# Patient Record
Sex: Female | Born: 1975 | Race: Black or African American | Hispanic: No | Marital: Married | Smoking: Never smoker
Health system: Southern US, Community
[De-identification: ages and names within clinical notes are randomized; demographics above are authoritative.]

## PROBLEM LIST (undated history)

## (undated) DIAGNOSIS — Z789 Other specified health status: Secondary | ICD-10-CM

## (undated) HISTORY — PX: BREAST BIOPSY: SHX20

## (undated) SURGERY — Surgical Case
Anesthesia: *Unknown

---

## 2013-04-25 ENCOUNTER — Emergency Department (HOSPITAL_BASED_OUTPATIENT_CLINIC_OR_DEPARTMENT_OTHER)
Admission: EM | Admit: 2013-04-25 | Discharge: 2013-04-25 | Disposition: A | Discharge status: Home / Self Care | Payer: BC Managed Care – PPO | Attending: Emergency Medicine | Admitting: Emergency Medicine

## 2013-04-25 ENCOUNTER — Encounter (HOSPITAL_BASED_OUTPATIENT_CLINIC_OR_DEPARTMENT_OTHER): Payer: Self-pay | Admitting: Emergency Medicine

## 2013-04-25 DIAGNOSIS — Z792 Long term (current) use of antibiotics: Secondary | ICD-10-CM | POA: Insufficient documentation

## 2013-04-25 DIAGNOSIS — Y929 Unspecified place or not applicable: Secondary | ICD-10-CM | POA: Insufficient documentation

## 2013-04-25 DIAGNOSIS — Z23 Encounter for immunization: Secondary | ICD-10-CM | POA: Insufficient documentation

## 2013-04-25 DIAGNOSIS — S61209A Unspecified open wound of unspecified finger without damage to nail, initial encounter: Secondary | ICD-10-CM | POA: Insufficient documentation

## 2013-04-25 DIAGNOSIS — Y939 Activity, unspecified: Secondary | ICD-10-CM | POA: Insufficient documentation

## 2013-04-25 DIAGNOSIS — W268XXA Contact with other sharp object(s), not elsewhere classified, initial encounter: Secondary | ICD-10-CM | POA: Insufficient documentation

## 2013-04-25 DIAGNOSIS — S61219A Laceration without foreign body of unspecified finger without damage to nail, initial encounter: Secondary | ICD-10-CM

## 2013-04-25 MED ORDER — TETANUS-DIPHTH-ACELL PERTUSSIS 5-2.5-18.5 LF-MCG/0.5 IM SUSP
0.5000 mL | Freq: Once | INTRAMUSCULAR | Status: AC
  Administered 2013-04-25: 0.5 mL via INTRAMUSCULAR
  Filled 2013-04-25: qty 0.5

## 2013-04-25 NOTE — ED Notes (Signed)
Laceration to left middle finger.  Bleeding through bandaid.

## 2013-04-25 NOTE — ED Notes (Signed)
Suture cart is at the bedside set up and ready for the doctor to use. 

## 2013-04-25 NOTE — ED Provider Notes (Signed)
CSN: [id number]     Arrival date & time 04/25/13  1735 History   First MD Initiated Contact with Patient 04/25/13 1910     Chief Complaint  Patient presents with  . Extremity Laceration   (Consider location/radiation/quality/duration/timing/severity/associated sxs/prior Treatment) Patient is a 37 y.o. female presenting with skin laceration. The history is provided by the patient. No language interpreter was used.  Laceration Location:  Finger Finger laceration location:  L middle finger Length (cm):  2 Depth:  Cutaneous Quality: avulsion   Bleeding: controlled   Time since incident:  1 hour Injury mechanism: scizzors. Pain details:    Severity:  No pain Foreign body present:  No foreign bodies Relieved by:  Nothing Worsened by:  Nothing tried Tetanus status:  Out of date Pt cut her finger with a scizzor.  Pt is a hairsylist   No past medical history on file. Past Surgical History  Procedure Laterality Date  . Cesarean section     No family history on file. History  Substance Use Topics  . Smoking status: Never Smoker   . Smokeless tobacco: Not on file  . Alcohol Use: Yes     Comment: occasional   OB History   Grav Para Term Preterm Abortions TAB SAB Ect Mult Living                 Review of Systems  Skin: Positive for wound.  All other systems reviewed and are negative.    Allergies  Review of patient's allergies indicates no known allergies.  Home Medications   Current Outpatient Rx  Name  Route  Sig  Dispense  Refill  . amoxicillin (AMOXIL) 250 MG capsule   Oral   Take 250 mg by mouth 3 (three) times daily.          BP 127/77  Pulse 75  Temp(Src) 99 F (37.2 C) (Oral)  Resp 16  Ht 5\' 7"  (1.702 m)  Wt 176 lb (79.833 kg)  BMI 27.56 kg/m2  SpO2 100%  LMP 03/25/2013 Physical Exam  Nursing note and vitals reviewed. Constitutional: She appears well-developed and well-nourished.  Cardiovascular: Normal rate.   Pulmonary/Chest: Effort normal.   Musculoskeletal: She exhibits tenderness.  2mm laceration middle dorsal finger,  gapping  Neurological: She is alert.  Skin: Skin is warm.  Psychiatric: She has a normal mood and affect.    ED Course  Procedures (including critical care time) Labs Review Labs Reviewed - No data to display Imaging Review No results found.  MDM   1. Laceration of finger of left hand, initial encounter    When edges are pulled together knuckle tissue puckers,    I advised pt steri stips  Will give less tension.   Td given    Elson Areas, PA-C 04/25/13 1954  Lonia Skinner Alva, PA-C 04/25/13 1955

## 2013-04-26 NOTE — ED Provider Notes (Signed)
Medical screening examination/treatment/procedure(s) were performed by non-physician practitioner and as supervising physician I was immediately available for consultation/collaboration.  Hurman Horn, MD 04/26/13 1341

## 2018-05-09 ENCOUNTER — Other Ambulatory Visit: Payer: Self-pay | Admitting: Obstetrics and Gynecology

## 2019-07-28 ENCOUNTER — Inpatient Hospital Stay (HOSPITAL_COMMUNITY)
Admission: AD | Admit: 2019-07-28 | Discharge: 2019-07-28 | Disposition: A | Discharge status: Home / Self Care | Payer: BC Managed Care – PPO | Attending: Obstetrics and Gynecology | Admitting: Obstetrics and Gynecology

## 2019-07-28 ENCOUNTER — Other Ambulatory Visit: Payer: Self-pay

## 2019-07-28 ENCOUNTER — Encounter (HOSPITAL_COMMUNITY): Payer: Self-pay | Admitting: Obstetrics and Gynecology

## 2019-07-28 DIAGNOSIS — Z3A01 Less than 8 weeks gestation of pregnancy: Secondary | ICD-10-CM | POA: Insufficient documentation

## 2019-07-28 DIAGNOSIS — O009 Unspecified ectopic pregnancy without intrauterine pregnancy: Secondary | ICD-10-CM | POA: Diagnosis present

## 2019-07-28 DIAGNOSIS — O09511 Supervision of elderly primigravida, first trimester: Secondary | ICD-10-CM | POA: Diagnosis not present

## 2019-07-28 LAB — COMPREHENSIVE METABOLIC PANEL
ALT: 16 U/L (ref 0–44)
AST: 14 U/L — ABNORMAL LOW (ref 15–41)
Albumin: 3.8 g/dL (ref 3.5–5.0)
Alkaline Phosphatase: 49 U/L (ref 38–126)
Anion gap: 9 (ref 5–15)
BUN: 13 mg/dL (ref 6–20)
CO2: 25 mmol/L (ref 22–32)
Calcium: 9 mg/dL (ref 8.9–10.3)
Chloride: 105 mmol/L (ref 98–111)
Creatinine, Ser: 0.63 mg/dL (ref 0.44–1.00)
GFR calc Af Amer: >60 mL/min (ref >60)
GFR calc non Af Amer: >60 mL/min (ref >60)
Glucose, Bld: 86 mg/dL (ref 70–99)
Potassium: 3.8 mmol/L (ref 3.5–5.1)
Sodium: 139 mmol/L (ref 135–145)
Total Bilirubin: 0.6 mg/dL (ref 0.3–1.2)
Total Protein: 7.4 g/dL (ref 6.5–8.1)

## 2019-07-28 LAB — CBC WITH DIFFERENTIAL/PLATELET
Abs Immature Granulocytes: 0.01 10*3/uL (ref 0.00–0.07)
Basophils Absolute: 0 10*3/uL (ref 0.0–0.1)
Basophils Relative: 0 %
Eosinophils Absolute: 0.1 10*3/uL (ref 0.0–0.5)
Eosinophils Relative: 2 %
HCT: 34.6 % — ABNORMAL LOW (ref 36.0–46.0)
Hemoglobin: 11.2 g/dL — ABNORMAL LOW (ref 12.0–15.0)
Immature Granulocytes: 0 %
Lymphocytes Relative: 23 %
Lymphs Abs: 1.2 10*3/uL (ref 0.7–4.0)
MCH: 29.9 pg (ref 26.0–34.0)
MCHC: 32.4 g/dL (ref 30.0–36.0)
MCV: 92.3 fL (ref 80.0–100.0)
Monocytes Absolute: 0.4 10*3/uL (ref 0.1–1.0)
Monocytes Relative: 7 %
Neutro Abs: 3.5 10*3/uL (ref 1.7–7.7)
Neutrophils Relative %: 68 %
Platelets: 202 10*3/uL (ref 150–400)
RBC: 3.75 MIL/uL — ABNORMAL LOW (ref 3.87–5.11)
RDW: 13.5 % (ref 11.5–15.5)
WBC: 5.3 10*3/uL (ref 4.0–10.5)
nRBC: 0 % (ref 0.0–0.2)

## 2019-07-28 LAB — HCG, QUANTITATIVE, PREGNANCY: hCG, Beta Chain, Quant, S: 155 m[IU]/mL — ABNORMAL HIGH (ref <5)

## 2019-07-28 MED ORDER — METHOTREXATE FOR ECTOPIC PREGNANCY
50.0000 mg/m2 | Freq: Once | INTRAMUSCULAR | Status: AC
  Administered 2019-07-28: 13:00:00 95 mg via INTRAMUSCULAR
  Filled 2019-07-28: qty 1

## 2019-07-28 NOTE — Discharge Instructions (Signed)
Return to care   If you have heavier bleeding that soaks through more that 2 pads per hour for an hour or more  If you bleed so much that you feel like you might pass out or you do pass out  If you have significant abdominal pain that is not improved with Tylenol   If you develop a fever > 100.5   Methotrexate Treatment for an Ectopic Pregnancy, Care After This sheet gives you information about how to care for yourself after your procedure. Your health care provider may also give you more specific instructions. If you have problems or questions, contact your health care provider. What can I expect after the procedure? After the procedure, it is common to have:  Abdominal cramping.  Vaginal bleeding.  Fatigue.  Nausea.  Vomiting.  Diarrhea. Blood tests will be taken at timed intervals for several days or weeks to check your pregnancy hormone levels. The blood tests will be done until the pregnancy hormone can no longer be detected in the blood. Follow these instructions at home: Activity  Do not have sex until your health care provider approves.  Limit activities that take a lot of effort as told by your health care provider. Medicines  Take over the counter and prescription medicines only as told by your health care provider.  Do not take aspirin, ibuprofen, naproxen, or any other NSAIDs.  Do not take folic acid, prenatal vitamins, or other vitamins that contain folic acid. General instructions   Do not drink alcohol.  Follow instructions from your health care provider on how and when to report any symptoms that may indicate a ruptured ectopic pregnancy.  Keep all follow-up visits as told by your health care provider. This is important. Contact a health care provider if:  You have persistent nausea and vomiting.  You have persistent diarrhea.  You are having a reaction to the medicine, such as: ? Tiredness. ? Skin rash. ? Hair loss. Get help right away  if:  Your abdominal or pelvic pain gets worse.  You have more vaginal bleeding.  You feel light-headed or you faint.  You have shortness of breath.  Your heart rate increases.  You develop a cough.  You have chills.  You have a fever. Summary  After the procedure, it is common to have symptoms of abdominal cramping, vaginal bleeding and fatigue. You may also experience other symptoms.  Blood tests will be taken at timed intervals for several days or weeks to check your pregnancy hormone levels. The blood tests will be done until the pregnancy hormone can no longer be detected in the blood.  Limit strenuous activity as told by your health care provider.  Follow instructions from your health care provider on how and when to report any symptoms that may indicate a ruptured ectopic pregnancy. This information is not intended to replace advice given to you by your health care provider. Make sure you discuss any questions you have with your health care provider. Document Released: 07/12/2011 Document Revised: 07/05/2017 Document Reviewed: 09/11/2016 Elsevier Patient Education  2020 [employer].

## 2019-07-28 NOTE — MAU Note (Signed)
.   Shannon Hensley is a 43 y.o. at [redacted]w[redacted]d here in MAU reporting: that she was sent over from office for methotrexate. Mild lower abdominal cramping per pt, with dark brown vaginal discharge  LMP: 07/07/19 Onset of complaint: ongoing Pain score: 2 Vitals:   07/28/19 1117  BP: 129/65  Pulse: (!) 121  Resp: 16  Temp: 98.2 F (36.8 C)     FHT Lab orders placed from triage:

## 2019-07-28 NOTE — MAU Provider Note (Signed)
Chief Complaint: Ectopic Pregnancy   First Provider Initiated Contact with Patient 07/28/19 1123     SUBJECTIVE HPI: Shannon Hensley is a 43 y.o. G1P0 at 21w0dby LMP (hx of irregular cycles) who presents to [facility] from the office for treatment of suspected ectopic pregnancy.  Patient reports lower abdominal cramping and vaginal spotting for the last month. Has had labs monitored in the office by Dr. PAlwyn Pea  Currently rates pain 2/10. Is not saturating pads or passing blood clots.    History reviewed. No pertinent past medical history. OB History  Gravida Para Term Preterm AB Living  5 3 3   1     SAB TAB Ectopic Multiple Live Births      1        # Outcome Date GA Lbr Len/2nd Weight Sex Delivery Anes PTL Lv  5 Current           4 Ectopic           3 Term      CS-LTranv     2 Term      CS-LTranv     1 Term      CS-LTranv      Past Surgical History:  Procedure Laterality Date  . CESAREAN SECTION     Social History   Socioeconomic History  . Marital status: Married    Spouse name: Not on file  . Number of children: Not on file  . Years of education: Not on file  . Highest education level: Not on file  Occupational History  . Not on file  Tobacco Use  . Smoking status: Never Smoker  Substance and Sexual Activity  . Alcohol use: Yes    Comment: occasional  . Drug use: Not on file  . Sexual activity: Yes  Other Topics Concern  . Not on file  Social History Narrative  . Not on file   Social Determinants of Health   Financial Resource Strain:   . Difficulty of Paying Living Expenses: Not on file  Food Insecurity:   . Worried About RCharity fundraiserin the Last Year: Not on file  . Ran Out of Food in the Last Year: Not on file  Transportation Needs:   . Lack of Transportation (Medical): Not on file  . Lack of Transportation (Non-Medical): Not on file  Physical Activity:   . Days of Exercise per Week: Not on file  . Minutes of Exercise per Session: Not  on file  Stress:   . Feeling of Stress : Not on file  Social Connections:   . Frequency of Communication with Friends and Family: Not on file  . Frequency of Social Gatherings with Friends and Family: Not on file  . Attends Religious Services: Not on file  . Active Member of Clubs or Organizations: Not on file  . Attends CArchivistMeetings: Not on file  . Marital Status: Not on file  Intimate Partner Violence:   . Fear of Current or Ex-Partner: Not on file  . Emotionally Abused: Not on file  . Physically Abused: Not on file  . Sexually Abused: Not on file   History reviewed. No pertinent family history. No current facility-administered medications on file prior to encounter.   No current outpatient medications on file prior to encounter.   No Known Allergies  I have reviewed patient's Past Medical Hx, Surgical Hx, Family Hx, Social Hx, medications and allergies.   Review of Systems  Constitutional: Negative.  Gastrointestinal: Positive for abdominal pain.  Genitourinary: Positive for vaginal bleeding.    OBJECTIVE Patient Vitals for the past 24 hrs:  BP Temp Pulse Resp Height Weight  07/28/19 1148 -- -- 76 -- -- --  07/28/19 1123 -- -- -- -- 5' 6"  (1.676 m) 77.6 kg  07/28/19 1117 129/65 98.2 F (36.8 C) (!) 121 16 -- --   Constitutional: Well-developed, well-nourished female in no acute distress.  Cardiovascular: normal rate & rhythm, no murmur Respiratory: normal rate and effort. Lung sounds clear throughout GI: Abd soft, non-tender, Pos BS x 4. No guarding or rebound tenderness MS: Extremities nontender, no edema, normal ROM Neurologic: Alert and oriented x 4.     LAB RESULTS Results for orders placed or performed during the hospital encounter of 07/28/19 (from the past 24 hour(s))  hCG, quantitative, pregnancy     Status: Abnormal   Collection Time: 07/28/19 11:52 AM  Result Value Ref Range   hCG, Beta Chain, Quant, S 155 (H) <5 mIU/mL  CBC WITH  DIFFERENTIAL     Status: Abnormal   Collection Time: 07/28/19 11:52 AM  Result Value Ref Range   WBC 5.3 4.0 - 10.5 K/uL   RBC 3.75 (L) 3.87 - 5.11 MIL/uL   Hemoglobin 11.2 (L) 12.0 - 15.0 g/dL   HCT 34.6 (L) 36.0 - 46.0 %   MCV 92.3 80.0 - 100.0 fL   MCH 29.9 26.0 - 34.0 pg   MCHC 32.4 30.0 - 36.0 g/dL   RDW 13.5 11.5 - 15.5 %   Platelets 202 150 - 400 K/uL   nRBC 0.0 0.0 - 0.2 %   Neutrophils Relative % 68 %   Neutro Abs 3.5 1.7 - 7.7 K/uL   Lymphocytes Relative 23 %   Lymphs Abs 1.2 0.7 - 4.0 K/uL   Monocytes Relative 7 %   Monocytes Absolute 0.4 0.1 - 1.0 K/uL   Eosinophils Relative 2 %   Eosinophils Absolute 0.1 0.0 - 0.5 K/uL   Basophils Relative 0 %   Basophils Absolute 0.0 0.0 - 0.1 K/uL   Immature Granulocytes 0 %   Abs Immature Granulocytes 0.01 0.00 - 0.07 K/uL  Comprehensive metabolic panel     Status: Abnormal   Collection Time: 07/28/19 11:52 AM  Result Value Ref Range   Sodium 139 135 - 145 mmol/L   Potassium 3.8 3.5 - 5.1 mmol/L   Chloride 105 98 - 111 mmol/L   CO2 25 22 - 32 mmol/L   Glucose, Bld 86 70 - 99 mg/dL   BUN 13 6 - 20 mg/dL   Creatinine, Ser 0.63 0.44 - 1.00 mg/dL   Calcium 9.0 8.9 - 10.3 mg/dL   Total Protein 7.4 6.5 - 8.1 g/dL   Albumin 3.8 3.5 - 5.0 g/dL   AST 14 (L) 15 - 41 U/L   ALT 16 0 - 44 U/L   Alkaline Phosphatase 49 38 - 126 U/L   Total Bilirubin 0.6 0.3 - 1.2 mg/dL   GFR calc non Af Amer >60 >60 mL/min   GFR calc Af Amer >60 >60 mL/min   Anion gap 9 5 - 15    IMAGING No results found.  MAU COURSE Orders Placed This Encounter  Procedures  . hCG, quantitative, pregnancy  . CBC WITH DIFFERENTIAL  . Comprehensive metabolic panel  . Notify physician  . Discharge patient   Meds ordered this encounter  Medications  . methotrexate [facility]) chemo injection kit 95 mg    MDM Dr. Alwyn Pea  called the unit yesterday evening regarding patient coming to MAU for methotrexate injection. Records were faxed from the office as she wanted  MAU provider to manage administration & orders.  Per review of faxed records, patient had abnormally rising HCGs (12/14=81, 12/17=108, 12/21=113), ultrasound showed no IUP or adnexal mass. RH positive.  Will send paperwork to medical records to be scanned.   Pt stable in MAU. Minimal cramping & spotting.  Labs collected for methotrexate admin (CBC, CMP, HCG) Hemoglobin, kidney function, & liver function normal. HCG 155 today Methotrexate given Pt will return to MAU on Friday for day 4 labs (office closed for holiday)   ASSESSMENT 1. Ectopic pregnancy without intrauterine pregnancy, unspecified location     PLAN Discharge home in stable condition. Ectopic precautions reviewed Discontinue prenatal vitamins Tylenol prn pain, discussed no NSAIDs Return Friday for day 4 HCG   Follow-up Information    [facility]. Go on 07/31/2019.   Specialty: Obstetrics and Gynecology Why: Return to MAU on Friday for bloodwork or sooner for worsening symptoms Contact information: [address] [id number] [place] [id number]         Allergies as of 07/28/2019   No Known Allergies     Medication List    STOP taking these medications   amoxicillin 250 MG capsule Commonly known as: Olga Coaster, NP 07/28/2019  1:52 PM

## 2019-07-31 ENCOUNTER — Inpatient Hospital Stay (HOSPITAL_COMMUNITY)
Admission: AD | Admit: 2019-07-31 | Discharge: 2019-07-31 | Disposition: A | Discharge status: Home / Self Care | Payer: BC Managed Care – PPO | Attending: Obstetrics and Gynecology | Admitting: Obstetrics and Gynecology

## 2019-07-31 ENCOUNTER — Other Ambulatory Visit: Payer: Self-pay

## 2019-07-31 ENCOUNTER — Inpatient Hospital Stay (HOSPITAL_COMMUNITY): Payer: BC Managed Care – PPO

## 2019-07-31 DIAGNOSIS — O09521 Supervision of elderly multigravida, first trimester: Secondary | ICD-10-CM | POA: Insufficient documentation

## 2019-07-31 DIAGNOSIS — R109 Unspecified abdominal pain: Secondary | ICD-10-CM

## 2019-07-31 DIAGNOSIS — Z3A01 Less than 8 weeks gestation of pregnancy: Secondary | ICD-10-CM | POA: Diagnosis not present

## 2019-07-31 DIAGNOSIS — O009 Unspecified ectopic pregnancy without intrauterine pregnancy: Secondary | ICD-10-CM

## 2019-07-31 LAB — HCG, QUANTITATIVE, PREGNANCY: hCG, Beta Chain, Quant, S: 157 m[IU]/mL — ABNORMAL HIGH (ref <5)

## 2019-07-31 NOTE — MAU Note (Signed)
Pt here for HCG level, day 4 post MTX.  States is doing ok.  Had horrible pain in lower abd, had her doubled over, couldn't walk. ( rated it a 10) Had discussion with pt about severity and seriousness of ectopic preg.  As her vs are stable and her pain is a 1 today, she is now fine. But stressed to pt , this is not something we ignore or take lightly- regardless of how low the numbers are. Pt verbalized understanding/compliance.  No longer bleeding, stopped after injection.  Pt to lobby to wait on results.

## 2019-07-31 NOTE — MAU Provider Note (Signed)
Subjective:  Shannon Hensley is a 43 y.o. G5P3010 at [redacted]w[redacted]d who presents today for FU BHCG. She was seen on 07/28/2019 for her first dose of MTX. Pt returns today for Day 4 hCG. Day 1 HCG 155. She denies vaginal bleeding. She reports abdominal or pelvic pain. Pt reports around 5AM today she experienced the worst pain she's ever had and rated it as 10/10. Pt reports she took a hot bath and the pain lessened. Pt reports pain currently as 3/10 and reports feeling "off" and endorses nausea and fatigue.  Objective:  Physical Exam  Nursing note and vitals reviewed.  Patient Vitals for the past 24 hrs:  BP Temp Temp src Pulse Resp SpO2  07/31/19 1554 128/77 99.7 F (37.6 C) Oral 66 16 100 %   Constitutional: She is oriented to person, place, and time. She appears well-developed and well-nourished. No distress.  HENT:  Head: Normocephalic.  Cardiovascular: Normal rate.  Respiratory: Effort normal.  GI: Soft. There is no tenderness.  Neurological: She is alert and oriented to person, place, and time. Skin: Skin is warm and dry.  Psychiatric: She has a normal mood and affect.   Results for orders placed or performed during the hospital encounter of 07/31/19 (from the past 24 hour(s))  hCG, quantitative, pregnancy     Status: Abnormal   Collection Time: 07/31/19  3:36 PM  Result Value Ref Range   hCG, Beta Chain, Quant, S 157 (H) <5 mIU/mL   US OB LESS THAN 14 WEEKS WITH OB TRANSVAGINAL  Result Date: 07/31/2019 CLINICAL DATA:  Patient has known ectopic pregnancy diagnosed by in office ultrasound. EXAM: OBSTETRIC <14 WK [place] AND TRANSVAGINAL OB US TECHNIQUE: Both transabdominal and transvaginal ultrasound examinations were performed for complete evaluation of the gestation as well as the maternal uterus, adnexal regions, and pelvic cul-de-sac. Transvaginal technique was performed to assess early pregnancy. COMPARISON:  None. FINDINGS: Intrauterine gestational sac: None. Subchorionic hemorrhage:  None  visualized. Maternal uterus/adnexae: Simple cyst or follicle noted right ovary. Left ovary unremarkable. Complex hypervascular lesion noted in the left adnexal space adjacent to the ovary. There may be some trace, simple free fluid in the cul-de-sac, but no evidence for hemoperitoneum. IMPRESSION: Complex left adnexal process measuring 2.6 x 3.3 x 1.9 cm identified adjacent to the left ovary. This is not an obvious ectopic gestational sac by ultrasound, but by report the patient is several days status post methotrexate treatment. This lesion does show some color signal on Doppler imaging and could represent an ectopic gestation. No substantial intraperitoneal free fluid or evidence of hemoperitoneum. Electronically Signed   By: Misty Stanley M.D.   On: 07/31/2019 17:41   Assessment/Plan: Ectopic pregnancy HCG appropriate for Day 4 Transvaginal US shows no substantial intraperitoneal free fluid or evidence of hemoperitoneum. FU Day 7 for: repeat hCG at [place] office as planned. Strict ectopic precautions given Pt discharged to home in stable condition

## 2019-08-01 ENCOUNTER — Other Ambulatory Visit: Payer: Self-pay

## 2019-08-01 ENCOUNTER — Inpatient Hospital Stay (HOSPITAL_COMMUNITY): Payer: BC Managed Care – PPO

## 2019-08-01 ENCOUNTER — Encounter (HOSPITAL_COMMUNITY): Payer: Self-pay | Admitting: Obstetrics and Gynecology

## 2019-08-01 ENCOUNTER — Observation Stay (HOSPITAL_COMMUNITY)
Admission: AD | Admit: 2019-08-01 | Discharge: 2019-08-02 | Disposition: A | Discharge status: Home / Self Care | Payer: BC Managed Care – PPO | Attending: Obstetrics & Gynecology | Admitting: Obstetrics & Gynecology

## 2019-08-01 DIAGNOSIS — Z20828 Contact with and (suspected) exposure to other viral communicable diseases: Secondary | ICD-10-CM | POA: Insufficient documentation

## 2019-08-01 DIAGNOSIS — O26891 Other specified pregnancy related conditions, first trimester: Secondary | ICD-10-CM

## 2019-08-01 DIAGNOSIS — R102 Pelvic and perineal pain: Secondary | ICD-10-CM | POA: Insufficient documentation

## 2019-08-01 DIAGNOSIS — Z3A01 Less than 8 weeks gestation of pregnancy: Secondary | ICD-10-CM | POA: Diagnosis not present

## 2019-08-01 DIAGNOSIS — O009 Unspecified ectopic pregnancy without intrauterine pregnancy: Secondary | ICD-10-CM | POA: Diagnosis not present

## 2019-08-01 HISTORY — DX: Other specified health status: Z78.9

## 2019-08-01 LAB — CBC
HCT: 33.6 % — ABNORMAL LOW (ref 36.0–46.0)
HCT: 31.7 % — ABNORMAL LOW (ref 36.0–46.0)
Hemoglobin: 10.6 g/dL — ABNORMAL LOW (ref 12.0–15.0)
Hemoglobin: 10.2 g/dL — ABNORMAL LOW (ref 12.0–15.0)
MCH: 29.7 pg (ref 26.0–34.0)
MCH: 29.5 pg (ref 26.0–34.0)
MCHC: 31.5 g/dL (ref 30.0–36.0)
MCHC: 32.2 g/dL (ref 30.0–36.0)
MCV: 94.1 fL (ref 80.0–100.0)
MCV: 91.6 fL (ref 80.0–100.0)
Platelets: 198 10*3/uL (ref 150–400)
Platelets: 184 10*3/uL (ref 150–400)
RBC: 3.57 MIL/uL — ABNORMAL LOW (ref 3.87–5.11)
RBC: 3.46 MIL/uL — ABNORMAL LOW (ref 3.87–5.11)
RDW: 13.7 % (ref 11.5–15.5)
RDW: 13.6 % (ref 11.5–15.5)
WBC: 5.9 10*3/uL (ref 4.0–10.5)
WBC: 5.6 10*3/uL (ref 4.0–10.5)
nRBC: 0 % (ref 0.0–0.2)
nRBC: 0 % (ref 0.0–0.2)

## 2019-08-01 MED ORDER — HYDROMORPHONE HCL 1 MG/ML IJ SOLN
1.0000 mg | Freq: Once | INTRAMUSCULAR | Status: AC
  Administered 2019-08-01: 09:00:00 1 mg via INTRAVENOUS
  Filled 2019-08-01: qty 1

## 2019-08-01 MED ORDER — LACTATED RINGERS IV BOLUS
1000.0000 mL | Freq: Once | INTRAVENOUS | Status: AC
  Administered 2019-08-01: 09:00:00 1000 mL via INTRAVENOUS

## 2019-08-01 MED ORDER — SODIUM CHLORIDE 0.9% FLUSH: 3.0000 mL | Freq: Two times a day (BID) | INTRAVENOUS | Status: DC

## 2019-08-01 MED ORDER — LACTATED RINGERS IV SOLN: INTRAVENOUS | Status: DC

## 2019-08-01 MED ORDER — DEXTROSE IN LACTATED RINGERS 5 % IV SOLN: INTRAVENOUS | Status: DC

## 2019-08-01 MED ORDER — ACETAMINOPHEN 325 MG PO TABS: 650.0000 mg | ORAL_TABLET | Freq: Four times a day (QID) | ORAL | Status: DC | PRN

## 2019-08-01 NOTE — MAU Note (Signed)
Pt states she began having extreme abdm  pain approx 1hr ago; rates pain as 7/10 that radiates to lower right back.  Pt states she experienced this same pain on 12/24, however, it had subsided before visit to MAU yesterday. Denies bleeding.

## 2019-08-01 NOTE — MAU Provider Note (Signed)
History     CSN: [id number]  Arrival date and time: 08/01/19 6644   First Provider Initiated Contact with Patient 08/01/19 [phone]      Chief Complaint  Patient presents with  . Abdominal Pain   Shannon Hensley is a 43 y.o. [id number] at [redacted]w[redacted]d who presents today with right lower abdominal pain. She states that this is similar to the pain she had prior to today, but much worse. She states that is is much more isolated to one area of her abdomen. She states that the pain is constant, and radiates to her back. She rates her pain 8/10 at this time. She states that rest and laying down helps the pain. She denies any vaginal bleeding.  Pelvic Pain The patient's primary symptoms include pelvic pain. The patient's pertinent negatives include no vaginal discharge. This is a new problem. The current episode started in the past 7 days. The problem occurs intermittently. The problem has been gradually worsening. The problem affects the right side. She is pregnant. Pertinent negatives include no chills, dysuria, fever, frequency, nausea or vomiting. The vaginal discharge was normal. There has been no bleeding. Nothing aggravates the symptoms. She has tried nothing for the symptoms.    OB History    Gravida  5   Para  3   Term  3   Preterm      AB  1   Living  3     SAB      TAB      Ectopic  1   Multiple      Live Births              Past Medical History:  Diagnosis Date  . Medical history non-contributory     Past Surgical History:  Procedure Laterality Date  . CESAREAN SECTION      No family history on file.  Social History   Tobacco Use  . Smoking status: Never Smoker  . Smokeless tobacco: Never Used  Substance Use Topics  . Alcohol use: Yes    Comment: occasional  . Drug use: Never    Allergies: No Known Allergies  No medications prior to admission.    Review of Systems  Constitutional: Negative for chills and fever.  Gastrointestinal: Negative for nausea  and vomiting.  Genitourinary: Positive for pelvic pain. Negative for dysuria, frequency, vaginal bleeding and vaginal discharge.   Physical Exam   Blood pressure 127/78, pulse 71, temperature 98.6 F (37 C), temperature source Oral, resp. rate 12, last menstrual period 07/07/2019, SpO2 100 %.  Physical Exam  Nursing note and vitals reviewed. Constitutional: She appears well-developed and well-nourished. No distress.  HENT:  Head: Normocephalic.  Cardiovascular: Normal rate.  Respiratory: Effort normal.  GI: Soft. There is abdominal tenderness. There is no rebound.  Skin: Skin is warm and dry.  Psychiatric: She has a normal mood and affect.   Results for orders placed or performed during the hospital encounter of 08/01/19 (from the past 24 hour(s))  CBC     Status: Abnormal   Collection Time: 08/01/19  9:10 AM  Result Value Ref Range   WBC 5.9 4.0 - 10.5 K/uL   RBC 3.57 (L) 3.87 - 5.11 MIL/uL   Hemoglobin 10.6 (L) 12.0 - 15.0 g/dL   HCT 87.5 (L) 64.3 - 32.9 %   MCV 94.1 80.0 - 100.0 fL   MCH 29.7 26.0 - 34.0 pg   MCHC 31.5 30.0 - 36.0 g/dL   RDW 51.8 84.1 -  15.5 %   Platelets 198 150 - 400 K/uL   nRBC 0.0 0.0 - 0.2 %   US OB Transvaginal  Result Date: 08/01/2019 CLINICAL DATA:  Known ectopic pregnancy with severe pain. Quantitative beta HCG 157 yesterday and 155 four days ago. Patient is day 5 post methotrexate treatment for known ectopic pregnancy. EXAM: OBSTETRIC <14 WK [place] AND TRANSVAGINAL OB US TECHNIQUE: Both transabdominal and transvaginal ultrasound examinations were performed for complete evaluation of the gestation as well as the maternal uterus, adnexal regions, and pelvic cul-de-sac. Transvaginal technique was performed to assess early pregnancy. COMPARISON:  07/31/2019 FINDINGS: Intrauterine gestational sac: Not visualized. Subchorionic hemorrhage:  None visualized. Maternal uterus/adnexae: Uterus normal size, shape and position with normal endometrial thickness  measuring 6 mm. Right ovary is normal size measuring 3.5 x 4.2 x 5.5 cm with normal peripheral vascularity. There is a 3.3 cm cyst with minimal debris without significant change. Left ovary measures 2.2 x 2.2 x 4.2 cm with normal color Doppler. There is heterogeneous material adjacent to the left ovary difficult to define to allow measurement. This likely represents residual of patient's known ectopic pregnancy. Small amount of complex free pelvic fluid. IMPRESSION: 1. Evidence of heterogeneous material adjacent to the left ovary difficult to define well enough to measure although subjectively not significantly changed compared to the previous exam and likely represents residual of patient's known ectopic pregnancy. 2. Normal uterus and endometrium. Left ovary is within normal. Right ovary demonstrates no significant change in a 3.3 cm minimally complicated cyst. Electronically Signed   By: Marin Olp M.D.   On: 08/01/2019 10:36   US OB LESS THAN 14 WEEKS WITH OB TRANSVAGINAL  Result Date: 07/31/2019 CLINICAL DATA:  Patient has known ectopic pregnancy diagnosed by in office ultrasound. EXAM: OBSTETRIC <14 WK [place] AND TRANSVAGINAL OB US TECHNIQUE: Both transabdominal and transvaginal ultrasound examinations were performed for complete evaluation of the gestation as well as the maternal uterus, adnexal regions, and pelvic cul-de-sac. Transvaginal technique was performed to assess early pregnancy. COMPARISON:  None. FINDINGS: Intrauterine gestational sac: None. Subchorionic hemorrhage:  None visualized. Maternal uterus/adnexae: Simple cyst or follicle noted right ovary. Left ovary unremarkable. Complex hypervascular lesion noted in the left adnexal space adjacent to the ovary. There may be some trace, simple free fluid in the cul-de-sac, but no evidence for hemoperitoneum. IMPRESSION: Complex left adnexal process measuring 2.6 x 3.3 x 1.9 cm identified adjacent to the left ovary. This is not an obvious ectopic  gestational sac by ultrasound, but by report the patient is several days status post methotrexate treatment. This lesion does show some color signal on Doppler imaging and could represent an ectopic gestation. No substantial intraperitoneal free fluid or evidence of hemoperitoneum. Electronically Signed   By: Misty Stanley M.D.   On: 07/31/2019 17:41    MAU Course  Procedures  MDM 1155: DW Dr. Glo Herring, he feels the patient needs to be evaluated for surgical management. He can come do this or we can discuss with Dr. Charlesetta Garibaldi. I will call Dr. Charlesetta Garibaldi at this time.  1200: DW Dr. Charlesetta Garibaldi reviewed [place] results, labs results with her. She will come assess the patient for possible surgical management.  1310: Dr. Charlesetta Garibaldi is here today assess the patient. She will obs on OBSC.   Assessment and Plan   1. Pelvic pain affecting pregnancy in first trimester, antepartum   2. Ectopic pregnancy, unspecified location, unspecified whether intrauterine pregnancy present    Care turned over the [facility] attending  Obs on OBSC     Thressa ShellerHeather Hogan 08/01/2019, 8:45 AM

## 2019-08-01 NOTE — Progress Notes (Signed)
Called ultrasound to determine timeframe for final [place] results.

## 2019-08-01 NOTE — H&P (Signed)
Shannon Hensley is [birth year]  Patient's last menstrual period was 07/07/2019. [redacted]w[redacted]d  Pt presents with presumed ectopic pregnancy because pt with some bleeding and pain.  She received MTX 12/22 and betas have been 155 and 157.  Today she did have pain tha resolved with dilaudid.  No n/v.  She has occ constipation.  She has stopped bleeding.   Pregnancy complicated by See above PMHX  Past Medical History:  Diagnosis Date  . Medical history non-contributory     PSURG HX  Past Surgical History:  Procedure Laterality Date  . CESAREAN SECTION      Fam HX No family history on file.  Soc HX  Social History   Socioeconomic History  . Marital status: Married    Spouse name: Not on file  . Number of children: Not on file  . Years of education: Not on file  . Highest education level: Not on file  Occupational History  . Not on file  Tobacco Use  . Smoking status: Never Smoker  . Smokeless tobacco: Never Used  Substance and Sexual Activity  . Alcohol use: Yes    Comment: occasional  . Drug use: Never  . Sexual activity: Yes  Other Topics Concern  . Not on file  Social History Narrative  . Not on file   Social Determinants of Health   Financial Resource Strain:   . Difficulty of Paying Living Expenses: Not on file  Food Insecurity:   . Worried About Charity fundraiser in the Last Year: Not on file  . Ran Out of Food in the Last Year: Not on file  Transportation Needs:   . Lack of Transportation (Medical): Not on file  . Lack of Transportation (Non-Medical): Not on file  Physical Activity:   . Days of Exercise per Week: Not on file  . Minutes of Exercise per Session: Not on file  Stress:   . Feeling of Stress : Not on file  Social Connections:   . Frequency of Communication with Friends and Family: Not on file  . Frequency of Social Gatherings with Friends and Family: Not on file  . Attends Religious Services: Not on file  . Active Member of Clubs or Organizations:  Not on file  . Attends Archivist Meetings: Not on file  . Marital Status: Not on file  Intimate Partner Violence:   . Fear of Current or Ex-Partner: Not on file  . Emotionally Abused: Not on file  . Physically Abused: Not on file  . Sexually Abused: Not on file    ROS  Review of Systems - Negative except above  [place] No IUP no large free fluid.  B adnexal cysts seen.    Gen WDWN  IN NAD CV RRR LUNGS CTAB ABD Gravid soft NT GU no bleeding EXT no calf tenderness B A/P: IUP [redacted]w[redacted]d Presumed ectopic s/p MTX  Admit to antepartum Pt given the choices of obs, surgery (d&C and possible laparoscopy, possible salpingectomy) Pt desires observation with serial CBC If she has surgery she may want B salpingectomy  Dr Alwyn Pea also aware and may come in of surgery is needed.

## 2019-08-01 NOTE — Progress Notes (Signed)
Dr. Charlesetta Garibaldi at the bedside.

## 2019-08-02 DIAGNOSIS — O009 Unspecified ectopic pregnancy without intrauterine pregnancy: Secondary | ICD-10-CM | POA: Diagnosis present

## 2019-08-02 LAB — SARS CORONAVIRUS 2 (TAT 6-24 HRS): SARS Coronavirus 2: NEGATIVE

## 2019-08-02 LAB — CBC
HCT: 30.6 % — ABNORMAL LOW (ref 36.0–46.0)
Hemoglobin: 9.7 g/dL — ABNORMAL LOW (ref 12.0–15.0)
MCH: 29.8 pg (ref 26.0–34.0)
MCHC: 31.7 g/dL (ref 30.0–36.0)
MCV: 94.2 fL (ref 80.0–100.0)
Platelets: 185 10*3/uL (ref 150–400)
RBC: 3.25 MIL/uL — ABNORMAL LOW (ref 3.87–5.11)
RDW: 13.6 % (ref 11.5–15.5)
WBC: 5.5 10*3/uL (ref 4.0–10.5)
nRBC: 0 % (ref 0.0–0.2)

## 2019-08-02 LAB — HCG, QUANTITATIVE, PREGNANCY: hCG, Beta Chain, Quant, S: 131 m[IU]/mL — ABNORMAL HIGH (ref <5)

## 2019-08-02 MED ORDER — ACETAMINOPHEN 325 MG PO TABS: 650.0000 mg | ORAL_TABLET | Freq: Four times a day (QID) | ORAL | 3 refills | Status: AC | PRN

## 2019-08-02 MED ORDER — IBUPROFEN 800 MG PO TABS: 800.0000 mg | ORAL_TABLET | Freq: Three times a day (TID) | ORAL | 3 refills | Status: AC | PRN

## 2019-08-02 NOTE — Progress Notes (Signed)
Subjective: Pt denies pain over night, but endorses was scared to move, pt was encouraged to move. Pt stated right before coming she was attempting a BM and they were hard little pellets then felt the pain and that is what prompted her to come in. Pt suspects there was some constipation component. Pt denies vaginal bleeding now and endorses feeling better, denies andominal pain, no cp, or sob. She does endorses feeling like there is a little pressure in her right lower quadrant. Patient reports + flatus and no problems voiding.    Objective: I have reviewed patient's vital signs, medications, labs and radiology results. BP 106/61 (BP Location: Right Arm)   Pulse 65   Temp 98.1 F (36.7 C) (Oral)   Resp 16   Ht 5\' 6"  (1.676 m)   Wt 77.6 kg   LMP 07/07/2019   SpO2 98%   BMI 27.60 kg/m  General: alert, cooperative, appears stated age and no distress Resp: clear to auscultation bilaterally Cardio: regular rate and rhythm, S1, S2 normal, no murmur, click, rub or gallop GI: soft, non-tender; bowel sounds normal; no masses,  no organomegaly Extremities: extremities normal, atraumatic, no cyanosis or edema Vaginal Bleeding: none  [place] on 07/31/2019: IMPRESSION: Complex left adnexal process measuring 2.6 x 3.3 x 1.9 cm identified adjacent to the left ovary. This is not an obvious ectopic gestational sac by ultrasound, but by report the patient is several days status post methotrexate treatment. This lesion does show some color signal on Doppler imaging and could represent an ectopic gestation.  No substantial intraperitoneal free fluid or evidence of Hemoperitoneum.  [place] on 08/01/2019: IMPRESSION: 1. Evidence of heterogeneous material adjacent to the left ovary difficult to define well enough to measure although subjectively not significantly changed compared to the previous exam and likely represents residual of patient's known ectopic pregnancy.  2. Normal uterus and endometrium.  Left ovary is within normal. Right ovary demonstrates no significant change in a 3.3 cm minimally complicated cyst.  Assessment/Plan: Shannon Hensley 43 y.o.,  LOS: 0 days,  [id number], [redacted]w[redacted]d, with presumed ectopic, S/P MTX, stable and improved.  Possible Ectopic: Pt stable, improved today, denies pain over the night, VSS, no vaginal bleeding or abdominal pain. Suspect constipation pain. Pt up tp restroom now. Pt encouraged to ambulate, hydrate. BETAS from 155 (5days took MTX) -157 (2days ago) -131 today.   Will discuss with DR Charlesetta Garibaldi, to make plan for surgery vs discharge home. Suspect since pt is feeling better and BETAS is downwardly treading pt will be discharged.    [place], [place] 08/02/2019, 8:23 AM

## 2019-08-02 NOTE — Plan of Care (Signed)
Patient stated that right lower groin pain has improved and she understands the plan the MD has for her based on lab data and change in complaint of pain.

## 2019-08-02 NOTE — Progress Notes (Signed)
Pt given discharge instructions. All questions answered. IV discontinued. All belongings sent with patient. Pt requested to ambulate at discharge in stable condition.

## 2019-08-03 NOTE — Discharge Summary (Signed)
GYN Discharge Summary     Patient Name: Shannon Hensley DOB: 07-Oct-1975 MRN: [id number]  Date of admission: 08/01/2019 Delivering MD: This patient has no babies on file.  Date of discharge: 08/03/2019  Admitting diagnosis: Ectopic pregnancy [O00.90] Intrauterine pregnancy: [redacted]w[redacted]d     Secondary diagnosis:  Principal Problem:   Ectopic pregnancy  Additional problems: None     Discharge diagnosis: Ectopic pregnancy                                                                                                Procedures:None  Treatment:  IV hydration, pain control, repeat beta HCG  Complications: None  Hospital course:  Patient admitted for 23 hour observation for 10/10 abdominal pain after treatment with methotrexate last week for suspected ectopic pregnancy. Her beta HCG was being trended in the office and reach a plateau of 113.  Ultrasound in the MAU showed no IUP, heterogenous material adjacent to the left ovary, likely represents residual of patients' known ectopic pregnancy. Small amount of complex free pelvic fluid.   Patient's pain resolved with IV hydration and pain medication. Beta HCG increased slightly then decreased. Patient remained hemodynamically stable and was discharged home on HD#1  Physical exam  Vitals:   08/01/19 1950 08/02/19 0015 08/02/19 0407 08/02/19 0834  BP: 115/65 (!) 109/56 106/61 119/64  Pulse: 83 77 65 66  Resp: 16 15 16 16   Temp: 98.8 F (37.1 C) 98.8 F (37.1 C) 98.1 F (36.7 C) 98.1 F (36.7 C)  TempSrc: Oral Oral Oral Oral  SpO2: 100% 99% 98% 98%  Weight:      Height:       General: alert, cooperative and no distress Vaginal bleeding: None Abdomen: soft, non-tender, non distended DVT Evaluation: No evidence of DVT seen on physical exam. Negative Homan's sign. Labs: Lab Results  Component Value Date   WBC 5.5 08/02/2019   HGB 9.7 (L) 08/02/2019   HCT 30.6 (L) 08/02/2019   MCV 94.2 08/02/2019   PLT 185 08/02/2019   Beta HCG:  155-->157-->131  CMP Latest Ref Rng & Units 07/28/2019  Glucose 70 - 99 mg/dL 86  BUN 6 - 20 mg/dL 13  Creatinine 07/30/2019 - 3.53 mg/dL 2.99  Sodium 2.42 - 683 mmol/L 139  Potassium 3.5 - 5.1 mmol/L 3.8  Chloride 98 - 111 mmol/L 105  CO2 22 - 32 mmol/L 25  Calcium 8.9 - 10.3 mg/dL 9.0  Total Protein 6.5 - 8.1 g/dL 7.4  Total Bilirubin 0.3 - 1.2 mg/dL 0.6  Alkaline Phos 38 - 126 U/L 49  AST 15 - 41 U/L 14(L)  ALT 0 - 44 U/L 16    Discharge instruction: Follow up in office next week. After Visit Summary  After visit meds:  PRN tylenol and motrin  Allergies as of 08/02/2019   No Known Allergies     Medication List    TAKE these medications   acetaminophen 325 MG tablet Commonly known as: TYLENOL Take 2 tablets (650 mg total) by mouth every 6 (six) hours as needed for moderate pain.   ibuprofen 800 MG tablet  Commonly known as: ADVIL Take 1 tablet (800 mg total) by mouth every 8 (eight) hours as needed.       Diet: routine diet  Activity: Advance as tolerated. Pelvic rest for 6 weeks.   Outpatient follow up:1 week  Disposition:home   08/03/2019 Sanjuana Kava, MD

## 2019-11-26 ENCOUNTER — Other Ambulatory Visit: Payer: Self-pay | Admitting: Obstetrics & Gynecology

## 2019-11-26 DIAGNOSIS — R928 Other abnormal and inconclusive findings on diagnostic imaging of breast: Secondary | ICD-10-CM

## 2019-12-07 ENCOUNTER — Ambulatory Visit
Admission: RE | Admit: 2019-12-07 | Discharge: 2019-12-07 | Disposition: A | Discharge status: Home / Self Care | Payer: BC Managed Care – PPO | Source: Ambulatory Visit | Attending: Obstetrics & Gynecology | Admitting: Obstetrics & Gynecology

## 2019-12-07 ENCOUNTER — Other Ambulatory Visit: Payer: Self-pay

## 2019-12-07 DIAGNOSIS — R928 Other abnormal and inconclusive findings on diagnostic imaging of breast: Secondary | ICD-10-CM

## 2020-10-31 IMAGING — US US OB TRANSVAGINAL
1 series · 15 of 28 positions shown · non-contrast
Comparison: 07/31/2019

CLINICAL DATA: Known ectopic pregnancy with severe pain.
is day 5 post methotrexate treatment for known ectopic pregnancy.

EXAM:
OBSTETRIC <14 WK US AND TRANSVAGINAL OB US
TECHNIQUE: Both transabdominal and transvaginal ultrasound examinations were
performed for complete evaluation of the gestation as well as the
maternal uterus, adnexal regions, and pelvic cul-de-sac.
Transvaginal technique was performed to assess early pregnancy.

[Series 1: us ob transvaginal · 15 of 52 slices shown]
[im 1/52]
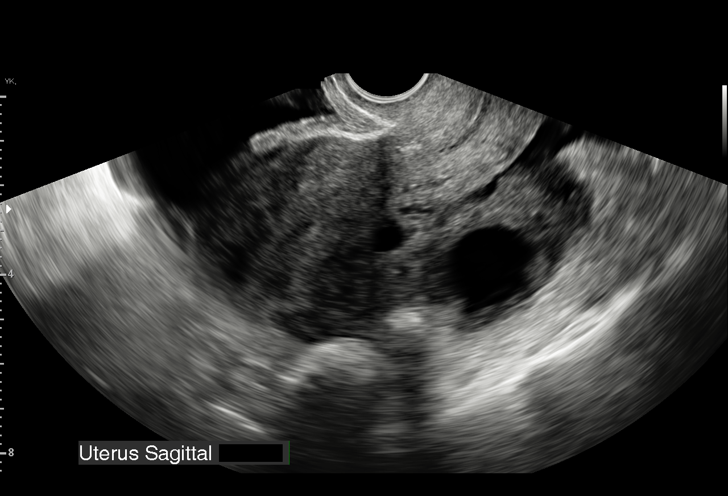
[im 4/52]
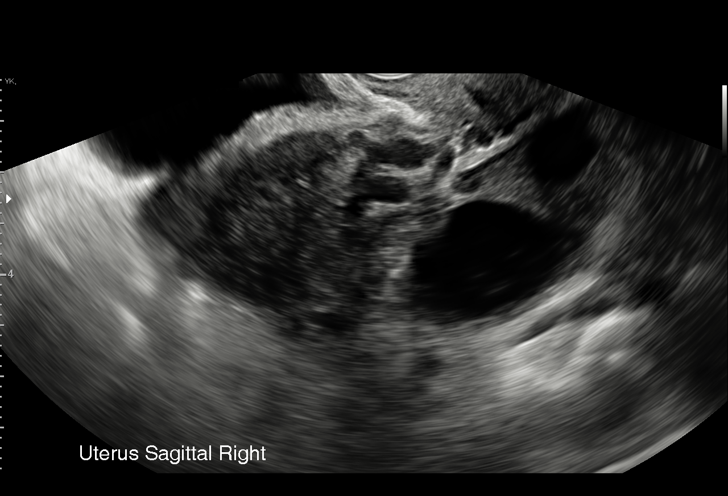
[im 8/52]
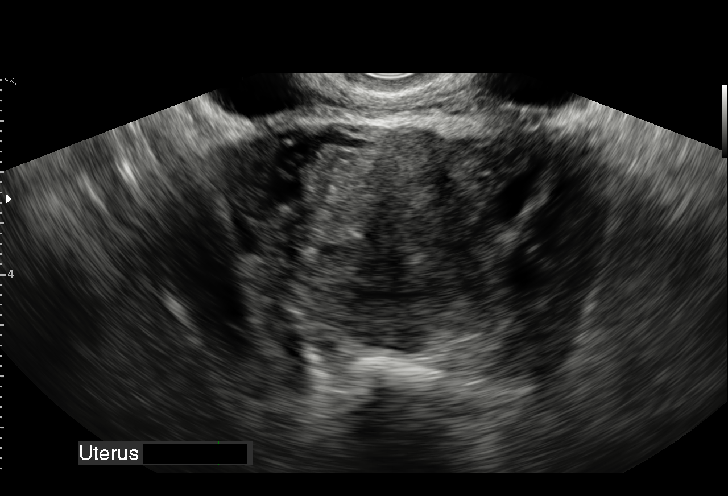
[im 12/52]
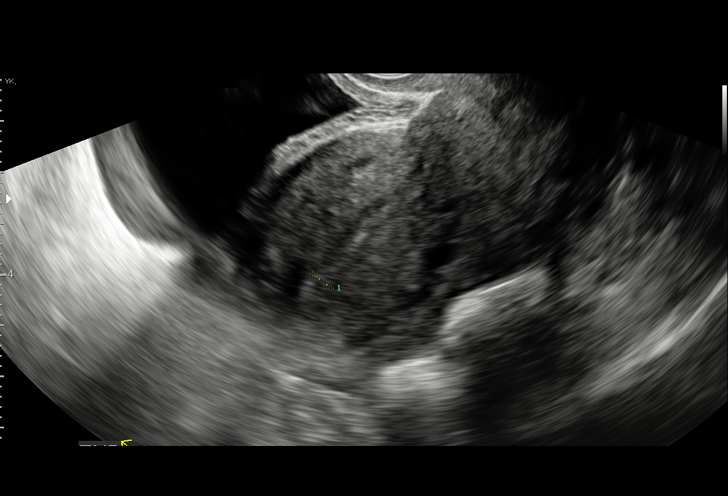
[im 16/52]
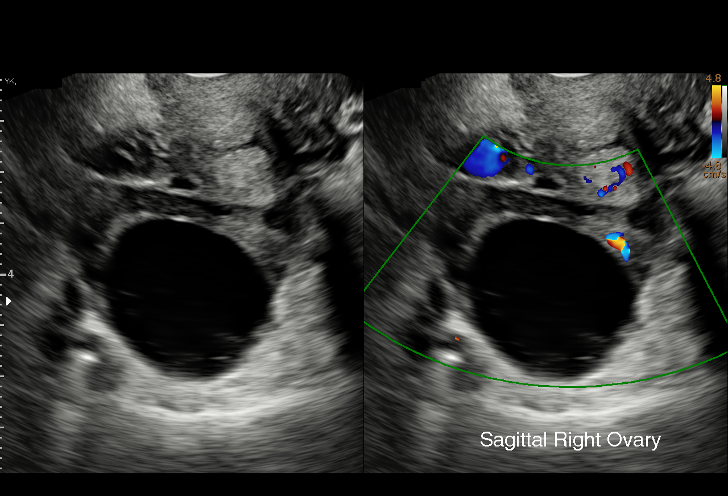
[im 19/52]
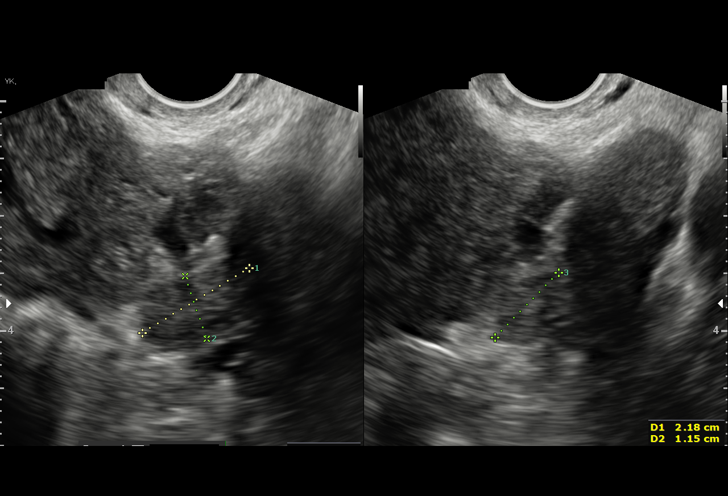
[im 23/52]
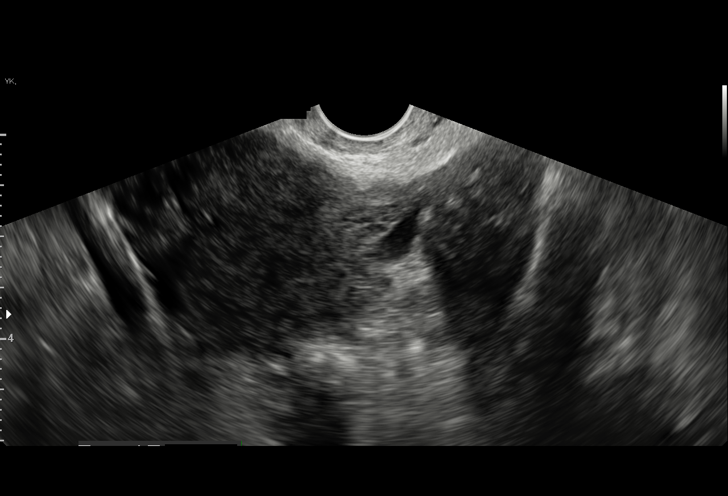
[im 27/52]
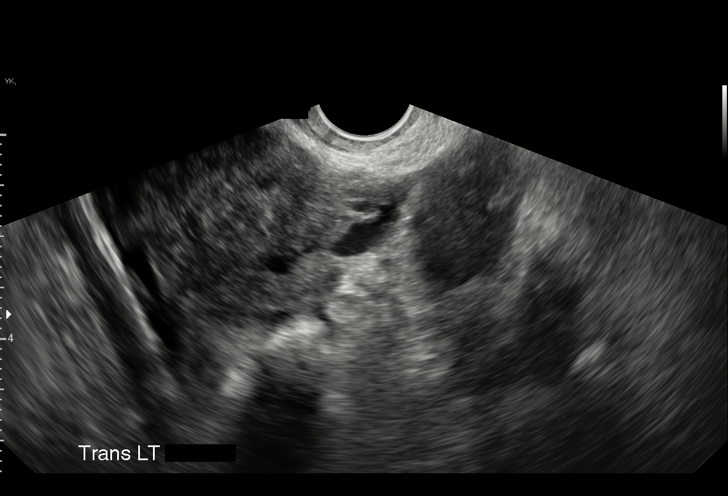
[im 29/52]
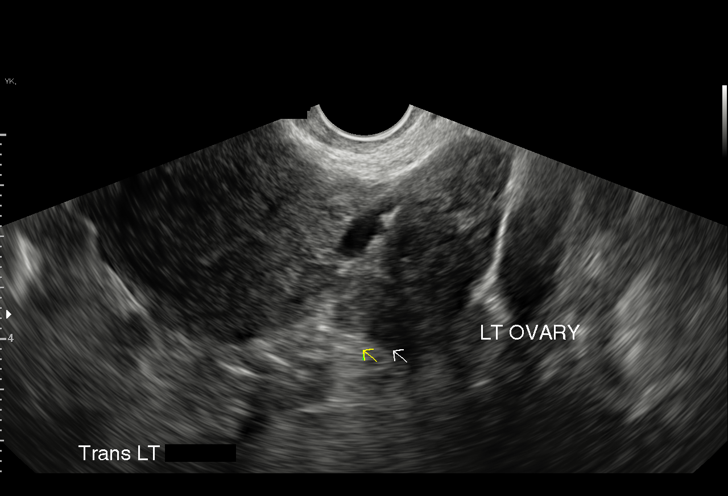
[im 33/52]
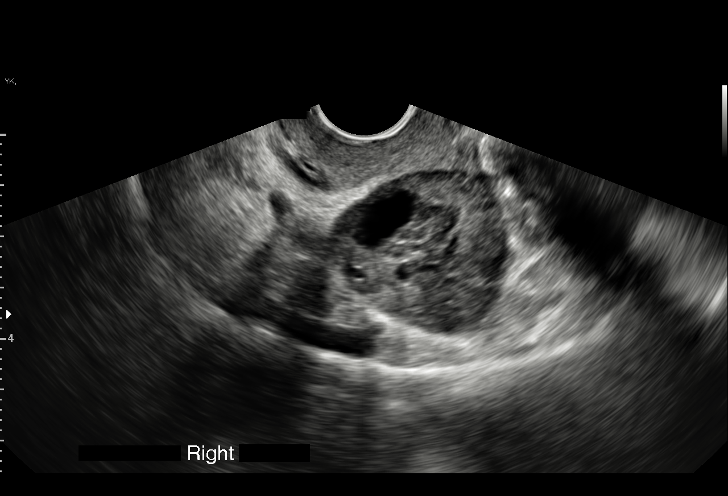
[im 36/52]
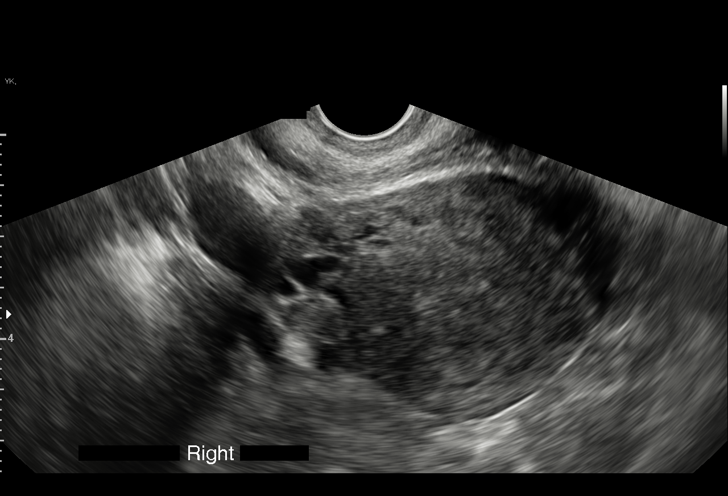
[im 40/52]
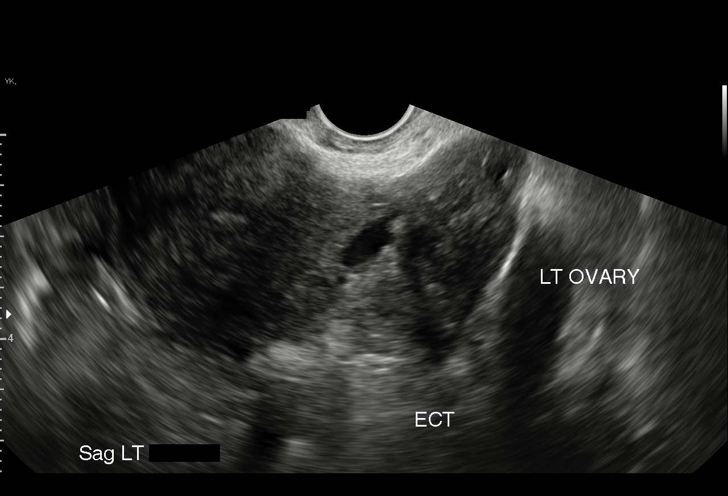
[im 44/52]
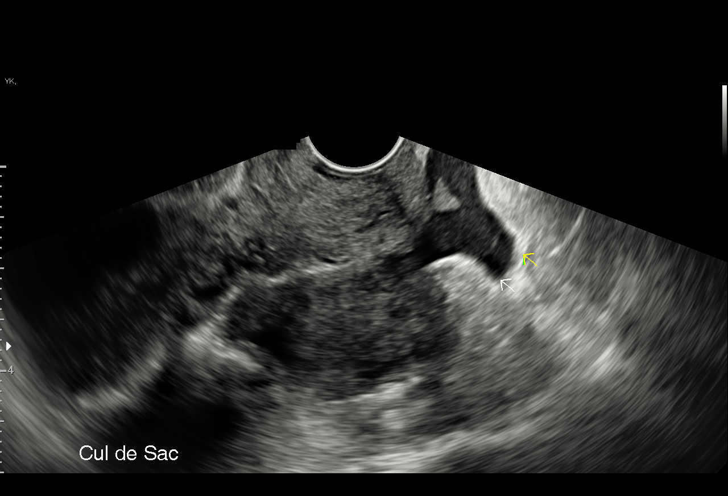
[im 48/52]
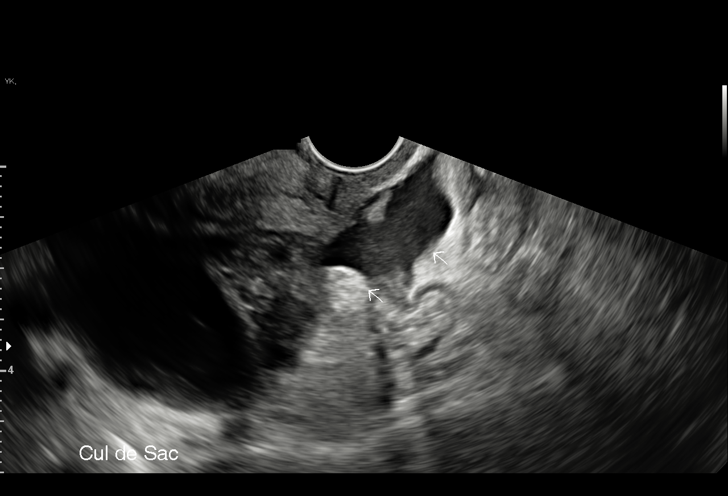
[im 52/52]
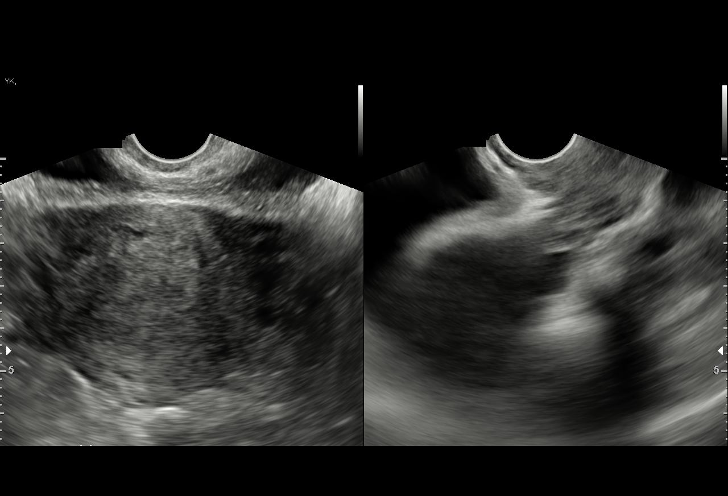

[15 of 28 positions shown; findings below may reference images not displayed]

FINDINGS: Intrauterine gestational sac: Not visualized.

Subchorionic hemorrhage:  None visualized.

Maternal uterus/adnexae: Uterus normal size, shape and position with
normal endometrial thickness measuring 6 mm. Right ovary is normal
size measuring 3.5 x 4.2 x 5.5 cm with normal peripheral
vascularity. There is a 3.3 cm cyst with minimal debris without
significant change.

Left ovary measures 2.2 x 2.2 x 4.2 cm with normal color Doppler.
There is heterogeneous material adjacent to the left ovary difficult
to define to allow measurement. This likely represents residual of
patient's known ectopic pregnancy. Small amount of complex free
pelvic fluid.
IMPRESSION: 1. Evidence of heterogeneous material adjacent to the left ovary
difficult to define well enough to measure although subjectively not
significantly changed compared to the previous exam and likely
represents residual of patient's known ectopic pregnancy.

2. Normal uterus and endometrium. Left ovary is within normal. Right
ovary demonstrates no significant change in a 3.3 cm minimally
complicated cyst.

## 2021-03-08 IMAGING — US US BREAST*R* LIMITED INC AXILLA
1 series · 7 of 7 positions shown · non-contrast
Comparison: Prior screening study, 11/17/2019.

CLINICAL DATA: Screening recall for a possible right breast mass.

EXAM:
DIGITAL DIAGNOSTIC RIGHT MAMMOGRAM WITH CAD AND TOMO
ULTRASOUND RIGHT BREAST

[Series 1: us breast*right* limited inc axilla · 0.05mm/px · 7 of 7 slices shown]
[im 1/7]
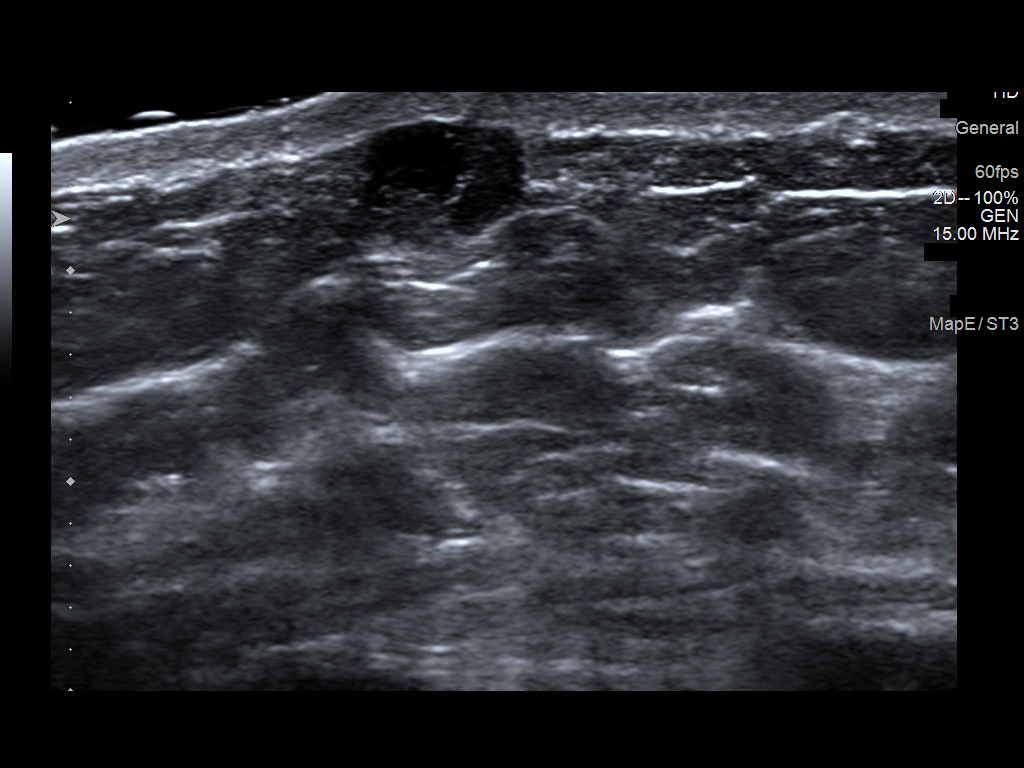
[im 2/7]
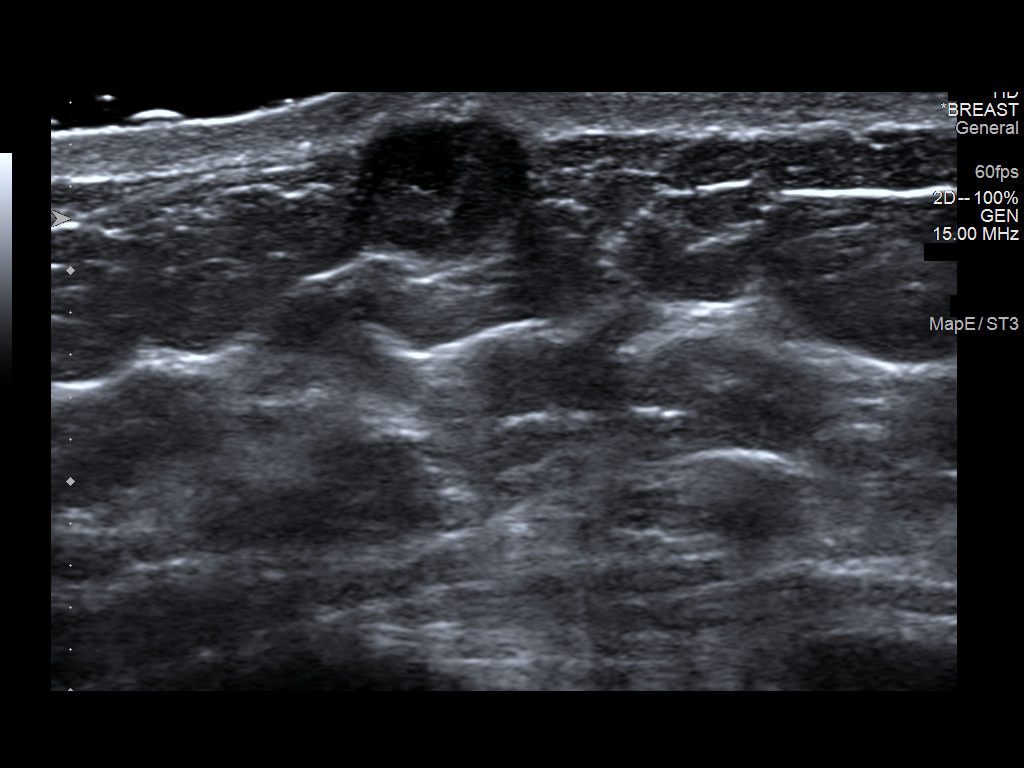
[im 3/7]
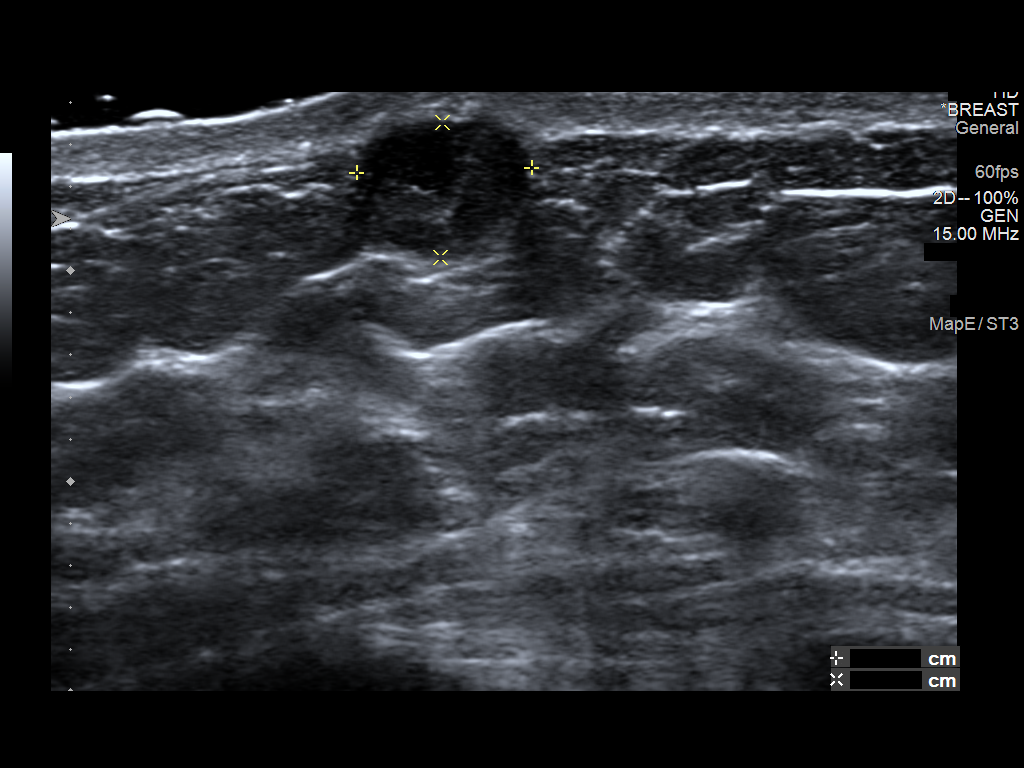
[im 4/7]
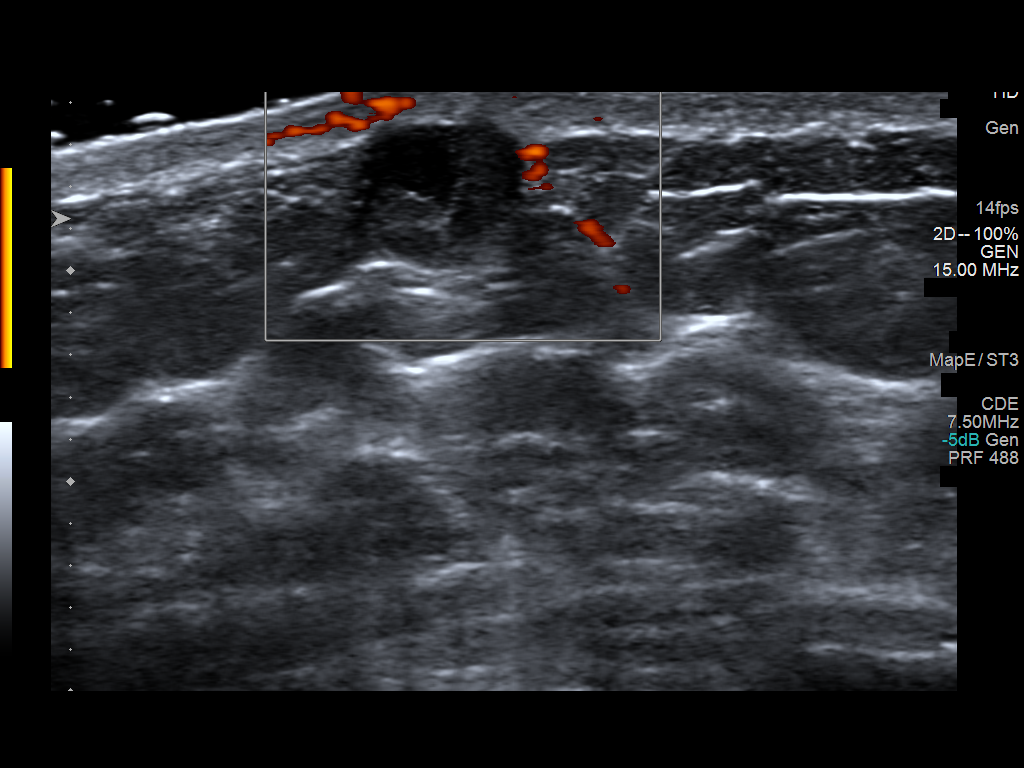
[im 5/7]
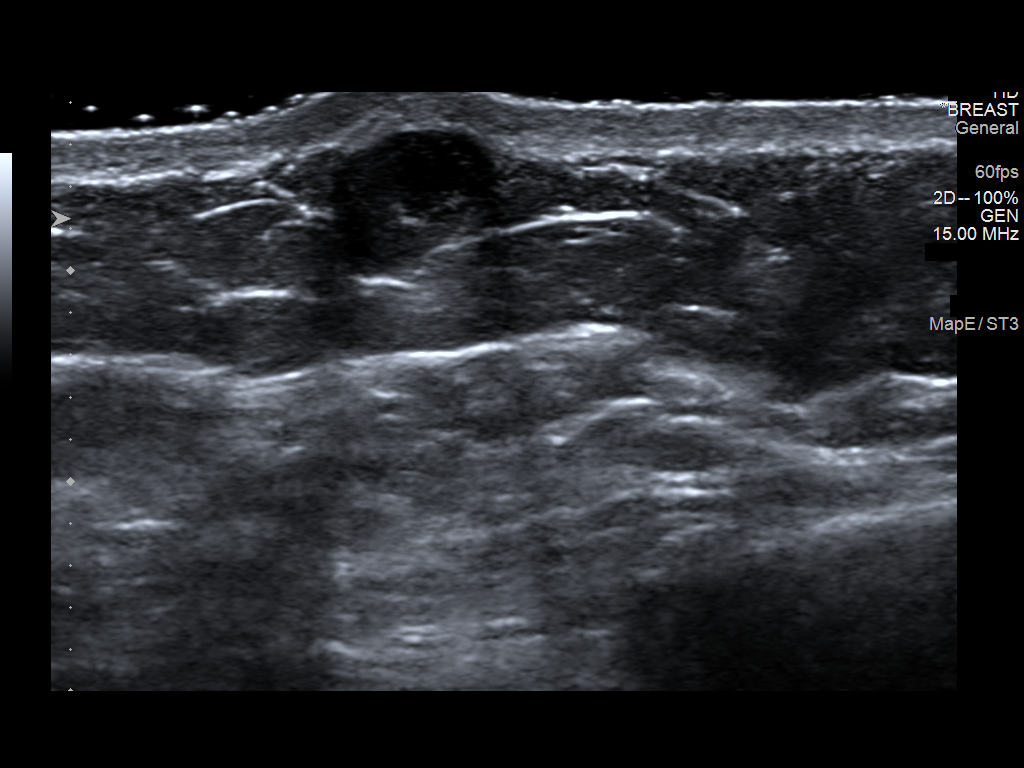
[im 6/7]
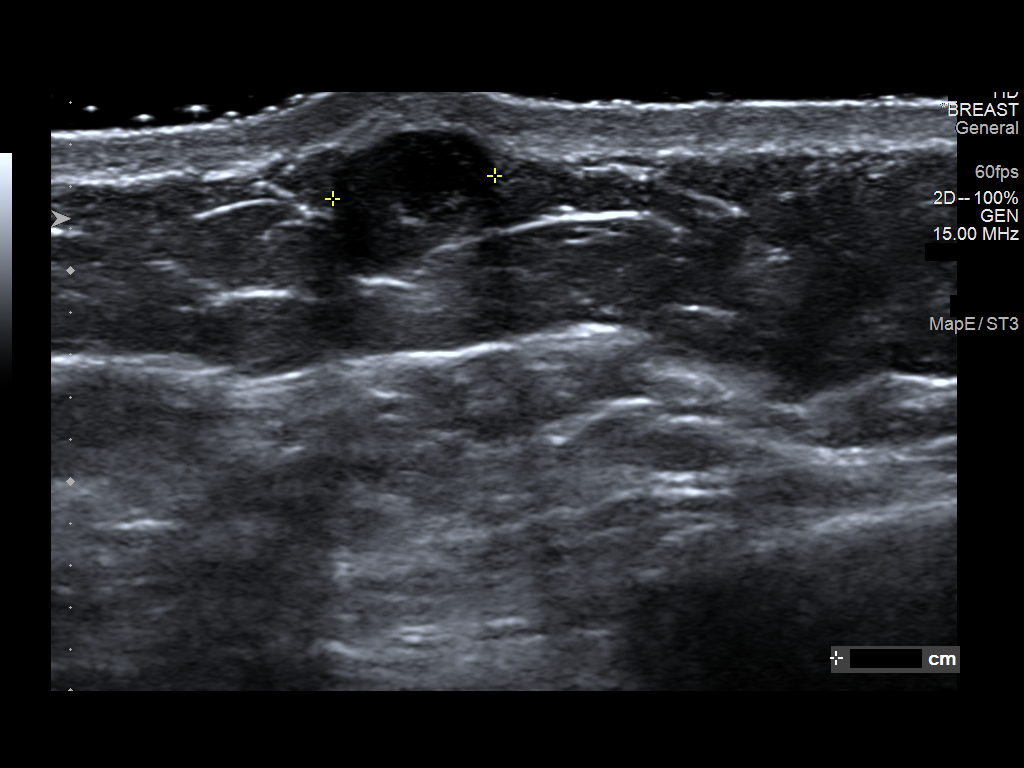
[im 7/7]
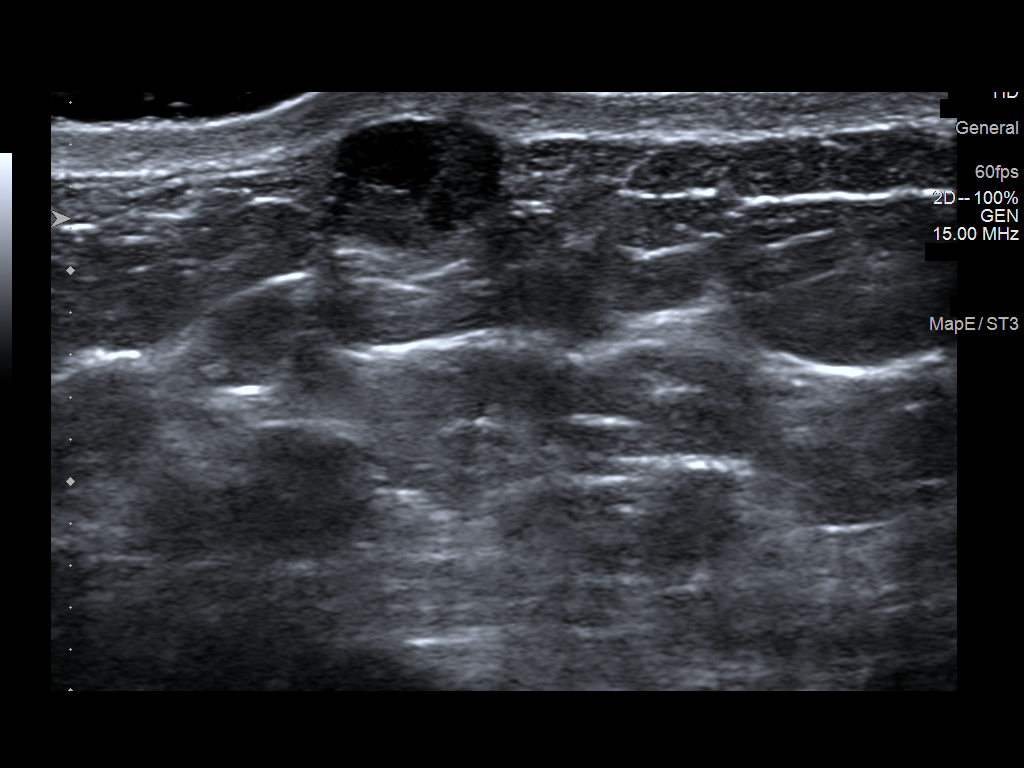

[7 of 7 positions shown; findings below may reference images not displayed]

No older studies
available.

ACR Breast Density Category c: The breast tissue is heterogeneously
dense, which may obscure small masses.
FINDINGS: The mass noted on the screening study projects within the skin of
the medial right breast. It is round and smoothly marginated. There
is an oval, circumscribed mass in the lateral right breast,
previously biopsied with benign results. There are no other breast
masses, no areas of architectural distortion and no suspicious
calcifications.

Mammographic images were processed with CAD.

On physical exam, there is a smooth superficial mass in the medial
right breast, which palpates within the skin.

Targeted ultrasound is performed, showing a sebaceous or inclusion
cyst in the right breast at 2 o'clock, 9 cm the nipple, measuring 8
x 6 x 8 mm, with a tract to the skin surface.
IMPRESSION: 1. No evidence of breast malignancy.
2. Benign sebaceous or epidermal inclusion cyst in the medial right
breast.

RECOMMENDATION:
Screening mammogram in one year.(Code:2T-T-IPB)

I have discussed the findings and recommendations with the patient.
If applicable, a reminder letter will be sent to the patient
regarding the next appointment.

BI-RADS CATEGORY  2: Benign.

## 2021-03-08 IMAGING — MG MM DIGITAL DIAGNOSTIC UNILAT*R* W/ TOMO W/ CAD
6 series · 6 of 18 positions shown · non-contrast
Comparison: Prior screening study, 11/17/2019.

CLINICAL DATA: Screening recall for a possible right breast mass.

EXAM:
DIGITAL DIAGNOSTIC RIGHT MAMMOGRAM WITH CAD AND TOMO
ULTRASOUND RIGHT BREAST

[R ML synth-2D]
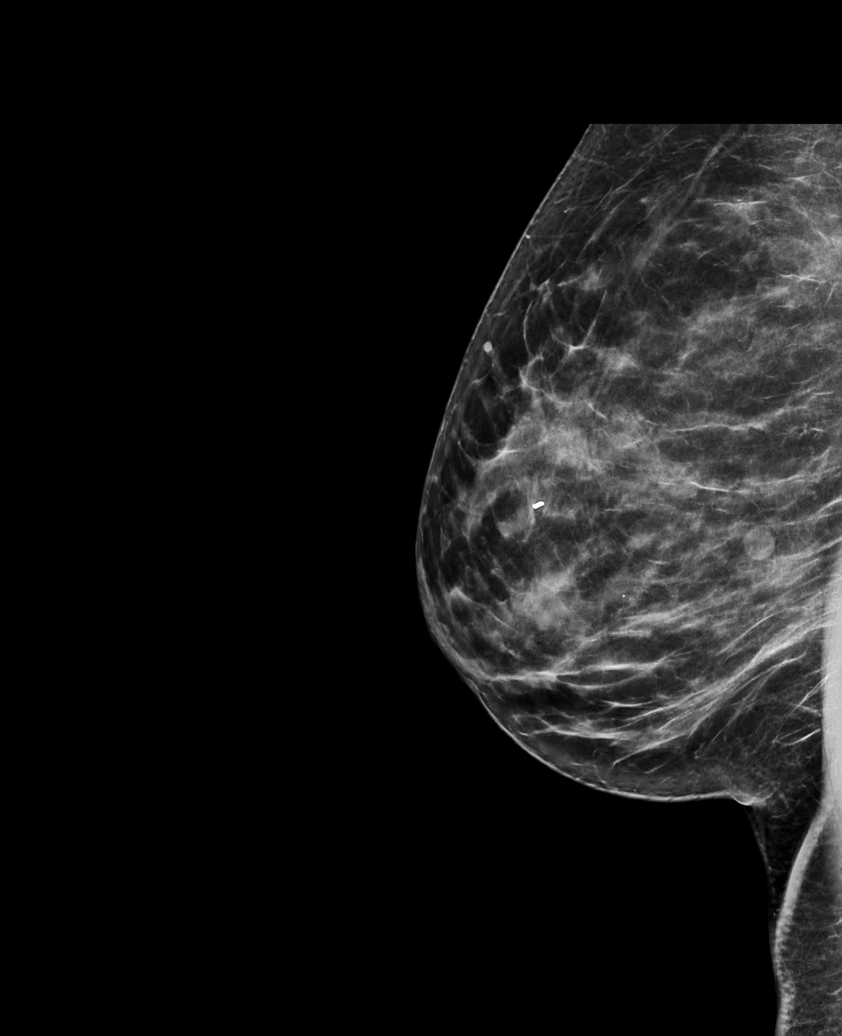

[R MLO synth-2D (1 of 2)]
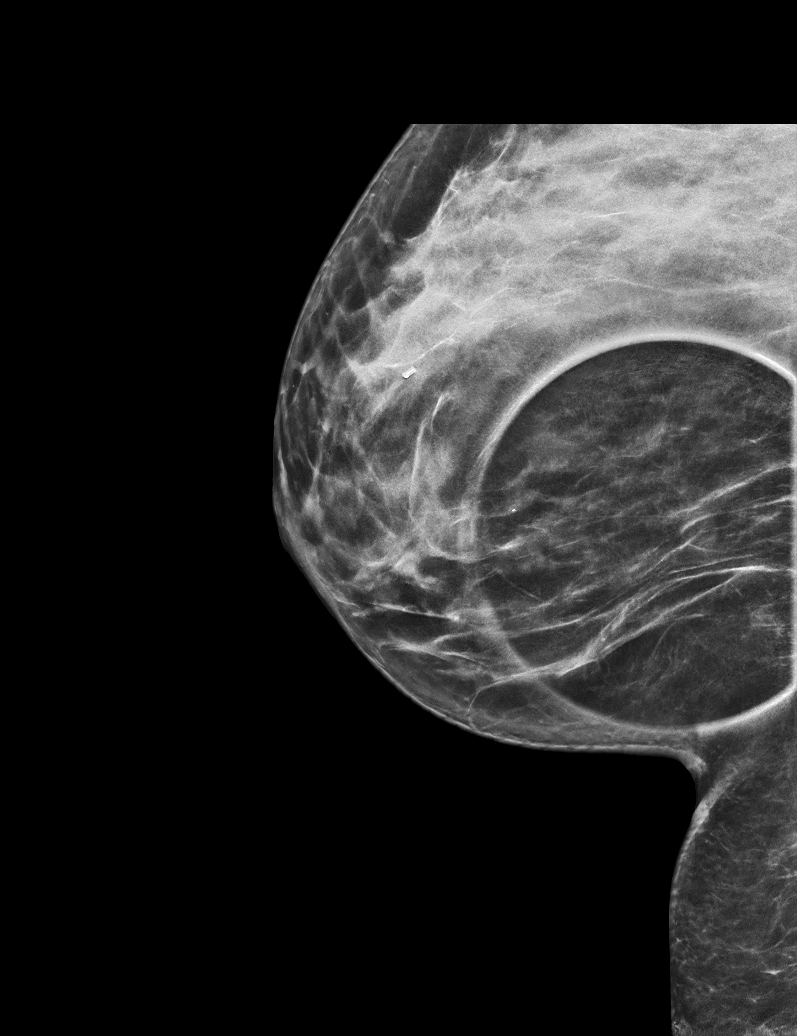

[R MLO synth-2D (2 of 2)]
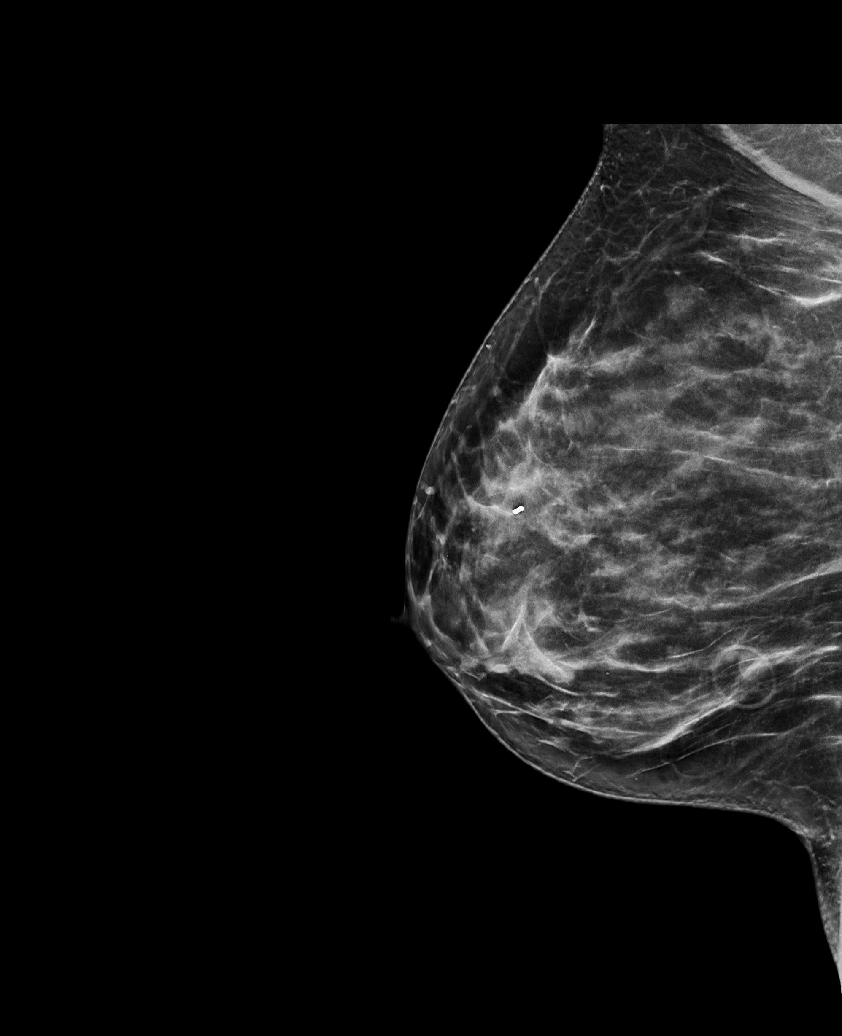

[R MLO tomo (1 of 2) · tomo slice 37/73.0]
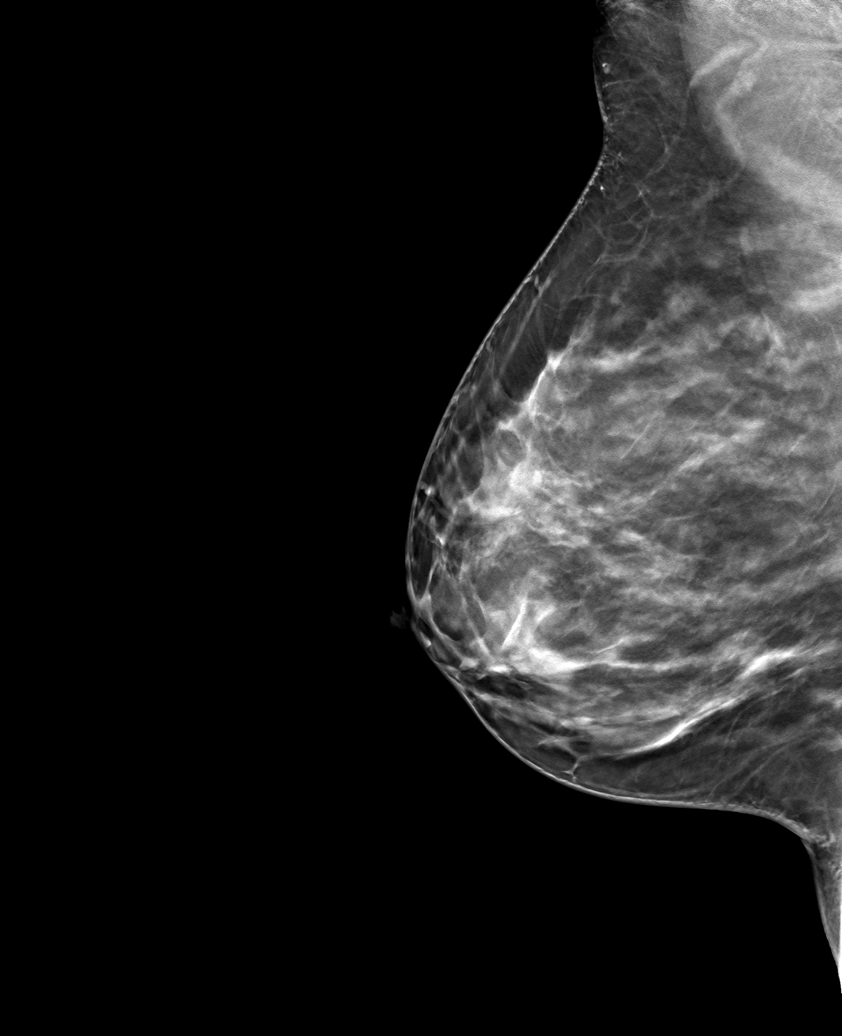

[R MLO tomo (2 of 2) · tomo slice 33/65.0]
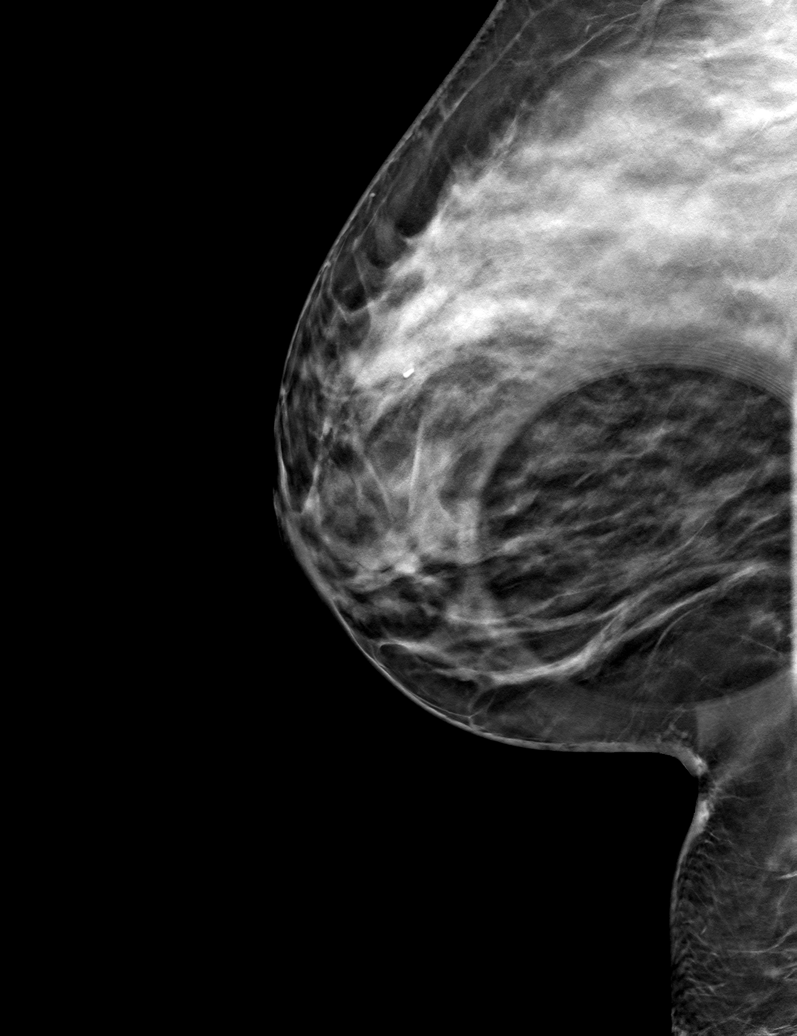

[R ML tomo · tomo slice 37/74.0]
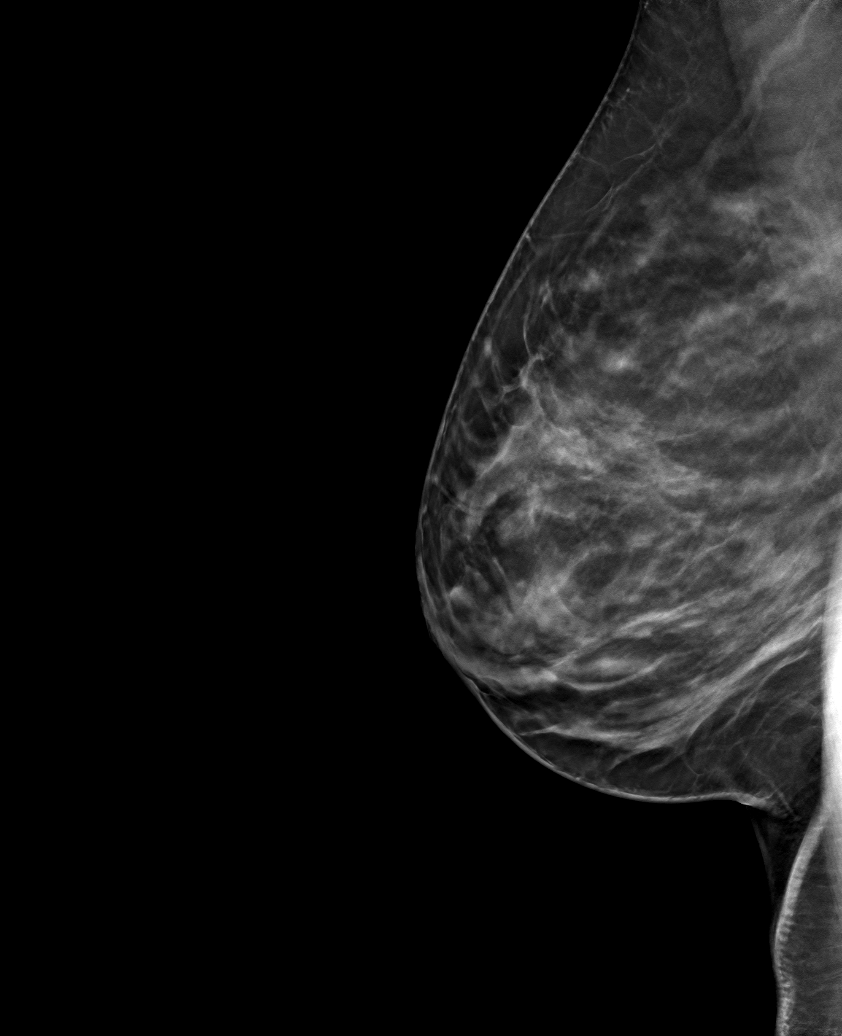

[6 of 18 positions shown; findings below may reference images not displayed]

No older studies
available.

ACR Breast Density Category c: The breast tissue is heterogeneously
dense, which may obscure small masses.
FINDINGS: The mass noted on the screening study projects within the skin of
the medial right breast. It is round and smoothly marginated. There
is an oval, circumscribed mass in the lateral right breast,
previously biopsied with benign results. There are no other breast
masses, no areas of architectural distortion and no suspicious
calcifications.

Mammographic images were processed with CAD.

On physical exam, there is a smooth superficial mass in the medial
right breast, which palpates within the skin.

Targeted ultrasound is performed, showing a sebaceous or inclusion
cyst in the right breast at 2 o'clock, 9 cm the nipple, measuring 8
x 6 x 8 mm, with a tract to the skin surface.
IMPRESSION: 1. No evidence of breast malignancy.
2. Benign sebaceous or epidermal inclusion cyst in the medial right
breast.

RECOMMENDATION:
Screening mammogram in one year.(Code:2T-T-IPB)

I have discussed the findings and recommendations with the patient.
If applicable, a reminder letter will be sent to the patient
regarding the next appointment.

BI-RADS CATEGORY  2: Benign.

## 2023-04-02 ENCOUNTER — Other Ambulatory Visit: Payer: Self-pay | Admitting: Obstetrics & Gynecology

## 2023-04-02 DIAGNOSIS — R928 Other abnormal and inconclusive findings on diagnostic imaging of breast: Secondary | ICD-10-CM

## 2023-04-25 LAB — EXTERNAL GENERIC LAB PROCEDURE: COLOGUARD: NEGATIVE

## 2023-05-10 ENCOUNTER — Ambulatory Visit
Admission: RE | Admit: 2023-05-10 | Discharge: 2023-05-10 | Disposition: A | Discharge status: Home / Self Care | Payer: BC Managed Care – PPO | Source: Ambulatory Visit | Attending: Obstetrics & Gynecology

## 2023-05-10 ENCOUNTER — Other Ambulatory Visit: Payer: Self-pay | Admitting: Obstetrics & Gynecology

## 2023-05-10 ENCOUNTER — Ambulatory Visit
Admission: RE | Admit: 2023-05-10 | Discharge: 2023-05-10 | Disposition: A | Discharge status: Home / Self Care | Payer: Managed Care, Other (non HMO) | Source: Ambulatory Visit | Attending: Obstetrics & Gynecology

## 2023-05-10 DIAGNOSIS — N6310 Unspecified lump in the right breast, unspecified quadrant: Secondary | ICD-10-CM

## 2023-05-10 DIAGNOSIS — R928 Other abnormal and inconclusive findings on diagnostic imaging of breast: Secondary | ICD-10-CM

## 2023-05-21 ENCOUNTER — Other Ambulatory Visit: Payer: Managed Care, Other (non HMO)

## 2023-05-22 ENCOUNTER — Other Ambulatory Visit: Payer: Self-pay | Admitting: Obstetrics & Gynecology

## 2023-05-28 NOTE — Plan of Care (Signed)
CHL Tonsillectomy/Adenoidectomy,Postoperative PEDs care plan entered in error.

## 2023-06-04 ENCOUNTER — Ambulatory Visit
Admission: RE | Admit: 2023-06-04 | Discharge: 2023-06-04 | Disposition: A | Discharge status: Home / Self Care | Payer: Managed Care, Other (non HMO) | Source: Ambulatory Visit | Attending: Obstetrics & Gynecology | Admitting: Obstetrics & Gynecology

## 2023-06-04 ENCOUNTER — Ambulatory Visit
Admission: RE | Admit: 2023-06-04 | Discharge: 2023-06-04 | Disposition: A | Discharge status: Home / Self Care | Payer: Managed Care, Other (non HMO) | Source: Ambulatory Visit | Attending: Obstetrics & Gynecology

## 2023-06-04 DIAGNOSIS — N6310 Unspecified lump in the right breast, unspecified quadrant: Secondary | ICD-10-CM

## 2023-06-04 HISTORY — PX: BREAST BIOPSY: SHX20

## 2023-06-05 LAB — SURGICAL PATHOLOGY

## 2023-06-07 ENCOUNTER — Other Ambulatory Visit: Payer: Self-pay | Admitting: Medical Genetics

## 2023-06-07 DIAGNOSIS — Z006 Encounter for examination for normal comparison and control in clinical research program: Secondary | ICD-10-CM

## 2023-07-29 ENCOUNTER — Encounter (HOSPITAL_BASED_OUTPATIENT_CLINIC_OR_DEPARTMENT_OTHER): Payer: Self-pay

## 2023-07-29 ENCOUNTER — Ambulatory Visit (HOSPITAL_BASED_OUTPATIENT_CLINIC_OR_DEPARTMENT_OTHER): Admit: 2023-07-29 | Payer: Managed Care, Other (non HMO) | Admitting: Obstetrics & Gynecology

## 2023-07-29 SURGERY — XI ROBOTIC ASSISTED SALPINGECTOMY
Anesthesia: General | Laterality: Bilateral

## 2024-01-17 ENCOUNTER — Encounter (HOSPITAL_BASED_OUTPATIENT_CLINIC_OR_DEPARTMENT_OTHER): Payer: Self-pay | Admitting: Emergency Medicine

## 2024-01-17 ENCOUNTER — Other Ambulatory Visit: Payer: Self-pay

## 2024-01-17 ENCOUNTER — Emergency Department (HOSPITAL_BASED_OUTPATIENT_CLINIC_OR_DEPARTMENT_OTHER)

## 2024-01-17 ENCOUNTER — Emergency Department (HOSPITAL_BASED_OUTPATIENT_CLINIC_OR_DEPARTMENT_OTHER)
Admission: EM | Admit: 2024-01-17 | Discharge: 2024-01-17 | Disposition: A | Discharge status: Home / Self Care | Attending: Emergency Medicine | Admitting: Emergency Medicine

## 2024-01-17 DIAGNOSIS — R600 Localized edema: Secondary | ICD-10-CM | POA: Diagnosis not present

## 2024-01-17 DIAGNOSIS — R2231 Localized swelling, mass and lump, right upper limb: Secondary | ICD-10-CM | POA: Diagnosis present

## 2024-01-17 NOTE — ED Notes (Signed)
 Pt also reports palpitations with some chest pain, denies at time of triage.

## 2024-01-17 NOTE — ED Provider Notes (Signed)
 [place] [facility] Provider Note   CSN: [id number] Arrival date & time: 01/17/24  1551     Patient presents with: Arm Swelling   Shannon Hensley is a 48 y.o. female.  Who presents to the ED for right upper extremity swelling.  Patient has experienced edema and ecchymosis in the right upper extremity for 2 weeks.  No inciting injury or trauma.  She was prescribed Motrin  prednisone yesterday to help with the inflammation.  Seen by PCP in the office earlier today who sent her here for further evaluation of potential right upper extremity DVT.  No prior history of VTE and she is not on oral contraceptives.  No recent surgeries.  She does have a family history of clotting disorder (father and family members on father side have a history of blood clots, unable to specify)   HPI     Prior to Admission medications   Medication Sig Start Date End Date Taking? Authorizing Provider  acetaminophen  (TYLENOL ) 325 MG tablet Take 2 tablets (650 mg total) by mouth every 6 (six) hours as needed for moderate pain. 08/02/19   Pinn, Walda, MD  ibuprofen  (ADVIL ) 800 MG tablet Take 1 tablet (800 mg total) by mouth every 8 (eight) hours as needed. 08/02/19   Pinn, Walda, MD    Allergies: Patient has no known allergies.    Review of Systems  Updated Vital Signs BP 137/75   Pulse 95   Temp 98.6 F (37 C) (Oral)   Resp 16   Ht 5' 6 (1.676 m)   Wt 83.9 kg   LMP 01/10/2024 (Exact Date)   SpO2 100%   BMI 29.86 kg/m   Physical Exam Vitals and nursing note reviewed.  HENT:     Head: Normocephalic and atraumatic.   Eyes:     Pupils: Pupils are equal, round, and reactive to light.    Cardiovascular:     Rate and Rhythm: Normal rate and regular rhythm.  Pulmonary:     Effort: Pulmonary effort is normal.     Breath sounds: Normal breath sounds.  Abdominal:     Palpations: Abdomen is soft.     Tenderness: There is no abdominal tenderness.    Musculoskeletal:     Comments: Mild edema of right forearm, asymmetric Tenderness to deep palpation of right forearm Soft compartments Sensation intact to light touch throughout right upper extremity 2+ symmetric radial pulses bilaterally 5 out of 5 motor strength throughout right hand forearm elbow No erythema or ecchymosis   Skin:    General: Skin is warm and dry.   Neurological:     Mental Status: She is alert.   Psychiatric:        Mood and Affect: Mood normal.     (all labs ordered are listed, but only abnormal results are displayed) Labs Reviewed - No data to display  EKG: None  Radiology: US  Venous Img Upper Uni Right(DVT) Result Date: 01/17/2024 CLINICAL DATA:  Right upper arm pain and swelling EXAM: RIGHT UPPER EXTREMITY VENOUS DOPPLER ULTRASOUND TECHNIQUE: Gray-scale sonography with graded compression, as well as color Doppler and duplex ultrasound were performed to evaluate the upper extremity deep venous system from the level of the subclavian vein and including the jugular, axillary, basilic, radial, ulnar and upper cephalic vein. Spectral Doppler was utilized to evaluate flow at rest and with distal augmentation maneuvers. COMPARISON:  None Available. FINDINGS: Contralateral Subclavian Vein: Respiratory phasicity is normal and symmetric with the symptomatic side.  No evidence of thrombus. Normal compressibility. Internal Jugular Vein: No evidence of thrombus. Normal compressibility, respiratory phasicity and response to augmentation. Subclavian Vein: No evidence of thrombus. Normal compressibility, respiratory phasicity and response to augmentation. Axillary Vein: No evidence of thrombus. Normal compressibility, respiratory phasicity and response to augmentation. Cephalic Vein: No evidence of thrombus. Normal compressibility, respiratory phasicity and response to augmentation. Basilic Vein: No evidence of thrombus. Normal compressibility, respiratory phasicity and  response to augmentation. Brachial Veins: No evidence of thrombus. Normal compressibility, respiratory phasicity and response to augmentation. Radial Veins: No evidence of thrombus. Normal compressibility, respiratory phasicity and response to augmentation. Ulnar Veins: No evidence of thrombus. Normal compressibility, respiratory phasicity and response to augmentation. Venous Reflux:  None visualized. Other Findings:  None visualized. IMPRESSION: No evidence of DVT within the right upper extremity. Electronically Signed   By: Violeta Grey M.D.   On: 01/17/2024 17:04     Procedures   Medications Ordered in the ED - No data to display                                  Medical Decision Making 48 year old female with history as above sent in by PCP given concern for potential right upper extremity DVT.  Exam notable for some mild asymmetric edema of the right forearm when compared to the left.  Some tenderness with the palpation of the forearm.  No skin changes, ecchymosis or erythema.  Circulation/sensation/motor intact throughout right upper extremity.  Ultrasound negative for DVT.  This may have been an episode of superficial thrombophlebitis that is resolving.  She will follow-up with her PCP and continue with ibuprofen  and prednisone for pain and inflammation        Final diagnoses:  Edema of right forearm    ED Discharge Orders     None          Sallyanne Creamer, DO 01/17/24 1710

## 2024-01-17 NOTE — ED Triage Notes (Signed)
 Pt POV c/o swelling and bruising in R arm x2 weeks. PCP expressed concern for blood clot. Prescribed prednisone and motrin  yesterday.

## 2024-01-17 NOTE — Discharge Instructions (Signed)
 You were seen in the emergency department for swelling of your right arm The ultrasound did not show any blood clot in your right arm We are not certain what is causing this It may have been related to a small blood clot in the superficial vein that is since cleared You can continue taking prednisone and ibuprofen  for pain and inflammation Follow-up with your PCP within 1 week for reevaluation Return to the Emergency Department for severe pain or any other concerns on right arm

## 2024-05-27 ENCOUNTER — Other Ambulatory Visit: Payer: Self-pay | Admitting: Medical Genetics

## 2024-05-27 DIAGNOSIS — Z006 Encounter for examination for normal comparison and control in clinical research program: Secondary | ICD-10-CM
